# Patient Record
Sex: Male | Born: 1955 | Race: Black or African American | Hispanic: No | Marital: Single | Smoking: Never smoker
Health system: Southern US, Community
[De-identification: ages and names within clinical notes are randomized; demographics above are authoritative.]

## PROBLEM LIST (undated history)

## (undated) DIAGNOSIS — E119 Type 2 diabetes mellitus without complications: Secondary | ICD-10-CM

## (undated) DIAGNOSIS — N433 Hydrocele, unspecified: Secondary | ICD-10-CM

---

## 2004-10-17 ENCOUNTER — Inpatient Hospital Stay (HOSPITAL_COMMUNITY): Admission: EM | Admit: 2004-10-17 | Discharge: 2004-10-20 | Payer: Self-pay | Admitting: Emergency Medicine

## 2004-10-22 HISTORY — PX: OTHER SURGICAL HISTORY: SHX169

## 2004-12-26 ENCOUNTER — Ambulatory Visit (HOSPITAL_COMMUNITY): Admission: RE | Admit: 2004-12-26 | Discharge: 2004-12-26 | Payer: Self-pay | Admitting: Orthopedic Surgery

## 2004-12-26 HISTORY — PX: OTHER SURGICAL HISTORY: SHX169

## 2006-06-24 ENCOUNTER — Emergency Department (HOSPITAL_COMMUNITY): Admission: EM | Admit: 2006-06-24 | Discharge: 2006-06-24 | Payer: Self-pay | Admitting: Emergency Medicine

## 2006-09-02 ENCOUNTER — Emergency Department (HOSPITAL_COMMUNITY): Admission: EM | Admit: 2006-09-02 | Discharge: 2006-09-02 | Payer: Self-pay | Admitting: Emergency Medicine

## 2007-04-23 ENCOUNTER — Encounter (INDEPENDENT_AMBULATORY_CARE_PROVIDER_SITE_OTHER): Payer: Self-pay | Admitting: Family Medicine

## 2007-04-23 ENCOUNTER — Ambulatory Visit: Payer: Self-pay | Admitting: Internal Medicine

## 2007-04-23 LAB — CONVERTED CEMR LAB
ALT: 23 units/L (ref 0–53)
BUN: 12 mg/dL (ref 6–23)
Cholesterol: 168 mg/dL (ref 0–200)
Glucose, Bld: 107 mg/dL — ABNORMAL HIGH (ref 70–99)
HCT: 42.8 % (ref 39.0–52.0)
LDL Cholesterol: 96 mg/dL (ref 0–99)
Lymphocytes Relative: 53 % — ABNORMAL HIGH (ref 12–46)
Lymphs Abs: 2.7 10*3/uL (ref 0.7–4.0)
MCHC: 32.5 g/dL (ref 30.0–36.0)
MCV: 91.8 fL (ref 78.0–100.0)
Monocytes Absolute: 0.3 10*3/uL (ref 0.1–1.0)
Monocytes Relative: 6 % (ref 3–12)
Neutrophils Relative %: 39 % — ABNORMAL LOW (ref 43–77)
Potassium: 4.7 meq/L (ref 3.5–5.3)
RBC: 4.66 M/uL (ref 4.22–5.81)
RDW: 13.6 % (ref 11.5–15.5)
Total Bilirubin: 0.3 mg/dL (ref 0.3–1.2)
VLDL: 15 mg/dL (ref 0–40)

## 2007-04-24 ENCOUNTER — Ambulatory Visit: Payer: Self-pay | Admitting: *Deleted

## 2007-06-04 ENCOUNTER — Ambulatory Visit: Payer: Self-pay | Admitting: Family Medicine

## 2007-06-06 ENCOUNTER — Ambulatory Visit (HOSPITAL_COMMUNITY): Admission: RE | Admit: 2007-06-06 | Discharge: 2007-06-06 | Payer: Self-pay | Admitting: Family Medicine

## 2007-06-24 ENCOUNTER — Ambulatory Visit: Payer: Self-pay | Admitting: Internal Medicine

## 2007-11-26 ENCOUNTER — Ambulatory Visit: Payer: Self-pay | Admitting: Internal Medicine

## 2007-12-03 ENCOUNTER — Ambulatory Visit: Payer: Self-pay | Admitting: Internal Medicine

## 2008-05-25 ENCOUNTER — Ambulatory Visit: Payer: Self-pay | Admitting: Family Medicine

## 2008-05-25 LAB — CONVERTED CEMR LAB
BUN: 12 mg/dL (ref 6–23)
CO2: 22 meq/L (ref 19–32)
Calcium: 9.2 mg/dL (ref 8.4–10.5)
Chloride: 103 meq/L (ref 96–112)
HDL: 53 mg/dL (ref >39)
LDL Cholesterol: 139 mg/dL — ABNORMAL HIGH (ref 0–99)
Sodium: 137 meq/L (ref 135–145)
Triglycerides: 80 mg/dL (ref <150)

## 2008-07-28 ENCOUNTER — Ambulatory Visit: Payer: Self-pay | Admitting: Internal Medicine

## 2008-08-03 ENCOUNTER — Ambulatory Visit: Payer: Self-pay | Admitting: Internal Medicine

## 2008-08-16 ENCOUNTER — Ambulatory Visit: Payer: Self-pay | Admitting: Internal Medicine

## 2008-08-31 ENCOUNTER — Ambulatory Visit: Payer: Self-pay | Admitting: Adult Health

## 2008-12-13 ENCOUNTER — Ambulatory Visit: Payer: Self-pay | Admitting: Internal Medicine

## 2009-01-17 ENCOUNTER — Ambulatory Visit: Payer: Self-pay | Admitting: Internal Medicine

## 2009-04-04 ENCOUNTER — Ambulatory Visit: Payer: Self-pay | Admitting: Internal Medicine

## 2009-04-05 ENCOUNTER — Encounter (INDEPENDENT_AMBULATORY_CARE_PROVIDER_SITE_OTHER): Payer: Self-pay | Admitting: Family Medicine

## 2009-04-12 ENCOUNTER — Ambulatory Visit: Payer: Self-pay | Admitting: Internal Medicine

## 2009-04-12 ENCOUNTER — Encounter (INDEPENDENT_AMBULATORY_CARE_PROVIDER_SITE_OTHER): Payer: Self-pay | Admitting: Adult Health

## 2009-04-12 LAB — CONVERTED CEMR LAB
ALT: 22 units/L (ref 0–53)
BUN: 15 mg/dL (ref 6–23)
CO2: 23 meq/L (ref 19–32)
Calcium: 9.8 mg/dL (ref 8.4–10.5)
Chloride: 93 meq/L — ABNORMAL LOW (ref 96–112)
Creatinine, Ser: 1.05 mg/dL (ref 0.40–1.50)
Sodium: 135 meq/L (ref 135–145)
Total Bilirubin: 0.3 mg/dL (ref 0.3–1.2)

## 2010-10-06 NOTE — Op Note (Signed)
NAME:  RAHKEEM, SENFT NO.:  [id number]   MEDICAL RECORD NO.:  [id number]          PATIENT TYPE:  OIB   LOCATION:  2899                         FACILITY:  [facility]   PHYSICIAN:  Burnard Bunting, M.D.    DATE OF BIRTH:  [birth year]   DATE OF PROCEDURE:  12/26/2004  DATE OF DISCHARGE:  12/26/2004                                 OPERATIVE REPORT   PREOPERATIVE DIAGNOSIS:  Retained hardware right ankle.   POSTOPERATIVE DIAGNOSIS:  Retained hardware right ankle.   OPERATION PERFORMED:  Removal of syndesmotic screw, right ankle.   SURGEON:  Burnard Bunting, M.D.   ANESTHESIA:  General endotracheal.   ESTIMATED BLOOD LOSS:  2 mL.   DRAINS:  None.   DESCRIPTION OF PROCEDURE:  The patient was brought to the operating room  where general anesthesia was induced.  Preoperative antibiotics were  administered.  Right ankle was prepped with Duraprep solution and draped in  sterile manner.  The prior incision was utilized.  The operative field was  covered with Ioban.  Ankle Esmarch was applied.  A 1 cm incision was made  over the syndesmotic screw.  Freer elevator was used to elevate soft tissue  on the anterior and posterior aspect of the screw head.  The screw was then  removed.  Deep fascia had to be divided in order to achieve screw  visualization.  Screws were removed and under fluoroscopic evaluation, the  syndesmosis was found to be stable.  The incision was then thoroughly  irrigated and the ankle Esmarch was released.  The incision was closed using  a 3-0 nylon suture.  Soft dressing was applied.  The patient tolerated the  procedure well without complications.       GSD/MEDQ  D:  12/26/2004  T:  12/26/2004  Job:  [id number]

## 2010-10-06 NOTE — H&P (Signed)
[name], Navarro               ACCOUNT NO.:  [id number]   MEDICAL RECORD NO.:  [id number]          PATIENT TYPE:  INP   LOCATION:  1823                         FACILITY:  [facility]   PHYSICIAN:  Burnard Bunting, M.D.    DATE OF BIRTH:  June 28, 1955   DATE OF ADMISSION:  10/17/2004  DATE OF DISCHARGE:                                HISTORY & PHYSICAL   CHIEF COMPLAINT:  Right ankle pain.   HISTORY OF PRESENT ILLNESS:  Mr. Joseph Navarro is a 55 year old patient  status post fall this afternoon. He reports right ankle pain. Denies any  other orthopedic complaints. Denies any loss of consciousness. He is unable  to bear weight on the right ankle. He reports some significant sharp,  shooting pain in the ankle region.   PAST MEDICAL HISTORY:  Negative.   PAST SURGICAL HISTORY:  Negative.   ALLERGIES:  He has no known drug allergies.   CURRENT MEDICATIONS:  None.   FAMILY HISTORY:  His family does live in [place].   SOCIAL HISTORY:  He works at a [employer].   REVIEW OF SYMPTOMS:  Negative for fevers, chills, abdominal pain, chest  pain, shortness of breath, or weight loss. There are 14 other systems  reviewed and are negative.   PHYSICAL EXAMINATION:  VITAL SIGNS:  Pulse 66, respiration 18, blood  pressure 130/63. Room air saturation is 94%.  CHEST:  Clear to auscultation.  HEART:  Regular rate and rhythm.  ABDOMEN:  Benign.  EXTREMITIES:  Right ankle does show slight deformity. He has DP pulses 2+/4.  He has an abrasion medially. Sensation is intact in dorsal and plantar  aspect of his foot. He does have good toe flexion and extension. He has no  joint tenderness on internal and external rotation of either leg. No other  mass or skin changes noted in the lower extremities. Knee range of motion is  full on the right with no effusion. Full range of motion without tenderness  of the wrist, elbows, and shoulders.  NECK:  He has good neck range of motion.   LABORATORY DATA:   Troponin negative x2. His sodium and potassium was 139 and  4.4. BUN and creatinine 15 and 1.1. Glucose 133. White count 8.7, hemoglobin  13.3. Chest x-ray is pending. X-ray of the ankle do show a fibula fracture,  possible syndesmotic injury with widening of the space.   IMPRESSION:  Right ankle fracture.   PLAN:  Open reduction/internal fixation with possible syndesmosis fixation.  Risks and benefits are discussed with the patient. They include but are not  limited to infection, nerve and vessel damage, blood clot formation,  nonunion or malunion, need for further surgery. The patient understands and  wishes to proceed. All questions were answered.      GSD/MEDQ  D:  10/17/2004  T:  10/18/2004  Job:  [id number]

## 2010-10-06 NOTE — Op Note (Signed)
NAME:  ILAN, KAHRS NO.:  [id number]   MEDICAL RECORD NO.:  [id number]          PATIENT TYPE:  INP   LOCATION:  5019                         FACILITY:  [facility]   PHYSICIAN:  Burnard Bunting, M.D.    DATE OF BIRTH:  05-Jun-1955   DATE OF PROCEDURE:  10/22/2004  DATE OF DISCHARGE:  10/20/2004                                 OPERATIVE REPORT   PREOPERATIVE DIAGNOSIS:  Right ankle fracture.   POSTOPERATIVE DIAGNOSIS:  Right ankle fracture.   OPERATION PERFORMED:  Right ankle fracture open reduction internal fixation  with syndesmotic fixation and lateral malleolar fixation.   SURGEON:  Burnard Bunting, M.D.   ASSISTANT:  None.   ANESTHESIA:  General endotracheal.   ESTIMATED BLOOD LOSS:  Minimal.   DESCRIPTION OF PROCEDURE:  The patient was brought to the operating room  where general endotracheal anesthesia was induced.  Preoperative IV  antibiotics were administered.  The right leg was prepped with DuraPrep  solution and draped in a sterile manner.  Collier Flowers was used to cover the  operative field.  The leg was elevated.  Ankle Esmarch was utilized for  approximately 40 minutes.  A lateral incision was made over the lateral  malleolus.  Skin and subcutaneous tissue were sharply divided.  Care was  taken to protect the superficial peroneal nerve. It was retracted  anteriorly.  The fracture site was identified.  It was reduced under direct  visualization.  Lag screw was placed from proximal anterior to distal  posterior with good compression of the fracture site achieved.  Buttress  plate was then placed.  One third Synthes tubular locking plate was utilized  six holes.  Good fixation in the proximal and distal fragments was achieved.  Under fluoroscopic evaluation, the syndesmosis was found to be unstable.  Syndesmotic screw was placed in bicortical fashion.  Good plane with  excellent fixation and reduction of the mortise achieved.  At this time the  ankle Esmarch  was released.  Bleeding points encountered were controlled  using electrocautery.  Incision was thoroughly irrigated and the fascia was  closed using 3-0 Vicryl suture.  Skin was closed using interrupted inverted  3-0 Vicryl suture and 3-0 simple nylon sutures.  Bulky splint was placed.  The patient tolerated the procedure well without immediate complications.  It should be noted that fluoroscopy was utilized at all times during the  case to verify placement of screw and hardware.      GSD/MEDQ  D:  10/22/2004  T:  10/23/2004  Job:  [id number]

## 2010-10-06 NOTE — Discharge Summary (Signed)
NAME:  [name], [name] NO.:  [id number]   MEDICAL RECORD NO.:  [id number]          PATIENT TYPE:  INP   LOCATION:  5019                         FACILITY:  [facility]   PHYSICIAN:  Burnard Bunting, M.D.    DATE OF BIRTH:  Sep 15, 1955   DATE OF ADMISSION:  10/17/2004  DATE OF DISCHARGE:  10/20/2004                                 DISCHARGE SUMMARY   DISCHARGE DIAGNOSIS:  Right ankle fracture.   SECONDARY DIAGNOSES:  None.   HOSPITAL COURSE:  Joseph Navarro is a 55 year old ambulatory male who fell  on the day of admission.  He sustained an ankle fracture with syndesmotic  injury to his right ankle.  He underwent open reduction and internal  fixation on Oct 17, 2004.  He tolerated the procedure well without  complication.  Dorsiflexion, plantar flexion and sensation to the toes was  intact on postop day #1.  The patient was started on Coumadin for DVT  prophylaxis.  He had an otherwise unremarkable recovery.  The patient was  discharged on October 20, 2004, in good condition.   DISCHARGE MEDICATIONS:  Percocet for pain, Robaxin as a muscle relaxer, as  well as Coumadin for DVT prophylaxis.   He will maintain his nonweightbearing status and see me back in seven days  for inspection of the incision.           ______________________________  G. Dorene Grebe, M.D.     GSD/MEDQ  D:  01/17/2005  T:  01/18/2005  Job:  [id number]

## 2012-01-20 ENCOUNTER — Emergency Department (HOSPITAL_COMMUNITY)
Admission: EM | Admit: 2012-01-20 | Discharge: 2012-01-20 | Disposition: A | Discharge status: Home / Self Care | Payer: Self-pay | Attending: Emergency Medicine | Admitting: Emergency Medicine

## 2012-01-20 ENCOUNTER — Encounter (HOSPITAL_COMMUNITY): Payer: Self-pay | Admitting: *Deleted

## 2012-01-20 DIAGNOSIS — W540XXA Bitten by dog, initial encounter: Secondary | ICD-10-CM | POA: Insufficient documentation

## 2012-01-20 DIAGNOSIS — S81009A Unspecified open wound, unspecified knee, initial encounter: Secondary | ICD-10-CM | POA: Insufficient documentation

## 2012-01-20 MED ORDER — TETANUS-DIPHTHERIA TOXOIDS TD 5-2 LFU IM INJ
0.5000 mL | INJECTION | Freq: Once | INTRAMUSCULAR | Status: AC
  Administered 2012-01-20: 0.5 mL via INTRAMUSCULAR
  Filled 2012-01-20: qty 0.5

## 2012-01-20 MED ORDER — SODIUM CHLORIDE 0.9 % IV SOLN
3.0000 g | Freq: Once | INTRAVENOUS | Status: AC
  Administered 2012-01-20: 3 g via INTRAVENOUS
  Filled 2012-01-20: qty 3

## 2012-01-20 MED ORDER — HYDROMORPHONE HCL PF 1 MG/ML IJ SOLN
1.0000 mg | Freq: Once | INTRAMUSCULAR | Status: AC
  Administered 2012-01-20: 1 mg via INTRAVENOUS
  Filled 2012-01-20: qty 1

## 2012-01-20 MED ORDER — OXYCODONE-ACETAMINOPHEN 7.5-325 MG PO TABS: 1.0000 | ORAL_TABLET | ORAL | Status: DC | PRN

## 2012-01-20 MED ORDER — TETANUS-DIPHTH-ACELL PERTUSSIS 5-2.5-18.5 LF-MCG/0.5 IM SUSP
INTRAMUSCULAR | Status: AC
  Filled 2012-01-20: qty 0.5

## 2012-01-20 MED ORDER — AMOXICILLIN-POT CLAVULANATE 875-125 MG PO TABS: 1.0000 | ORAL_TABLET | Freq: Two times a day (BID) | ORAL | Status: DC

## 2012-01-20 NOTE — ED Notes (Signed)
Pt was visiting an acquaintance who turned out to not be at home when their pit bull dog came out and attacked him. He sustained significant bite wound to R lateral LE and L medial LE with scattered abrasions to BLE. Muscle, adipose and fascia exposed. Wounds covered with wet NS gauze. Attack occurred at approx 1230. After attack, pt drove himself to his residence and called EMS.

## 2012-01-20 NOTE — ED Provider Notes (Addendum)
History     CSN: [id number]  Arrival date & time 01/20/12  1425   First MD Initiated Contact with Patient 01/20/12 [phone]      Chief Complaint  Patient presents with  . Animal Bite    (Consider location/radiation/quality/duration/timing/severity/associated sxs/prior treatment) Patient is a 56 y.o. male presenting with animal bite. The history is provided by the patient.  Animal Bite    Patient here after being bit by a dog just prior to arrival just prior to arrival. Patient complains of pain to his calves with associated large lacerations. Attack was provoked by the patient walked into a dog territory. No concern for rabies at this time. Patient's wounds were dressed and patient presents here. Denies any numbness or tingling in his feet. Patient's tetanus status is not current Past Medical History  Diagnosis Date  . Diabetes mellitus     History reviewed. No pertinent past surgical history.  No family history on file.  History  Substance Use Topics  . Smoking status: Never Smoker   . Smokeless tobacco: Not on file  . Alcohol Use: Yes      Review of Systems  All other systems reviewed and are negative.    Allergies  Review of patient's allergies indicates no known allergies.  Home Medications  No current outpatient prescriptions on file.  BP 131/85  Pulse 80  Temp 97.9 F (36.6 C) (Temporal)  Resp 18  SpO2 97%  Physical Exam  Nursing note and vitals reviewed. Constitutional: He is oriented to person, place, and time. Vital signs are normal. He appears well-developed and well-nourished.  Non-toxic appearance. No distress.  HENT:  Head: Normocephalic and atraumatic.  Eyes: Conjunctivae, EOM and lids are normal. Pupils are equal, round, and reactive to light.  Neck: Normal range of motion. Neck supple. No tracheal deviation present. No mass present.  Cardiovascular: Normal rate, regular rhythm and normal heart sounds.  Exam reveals no gallop.   No murmur  heard. Pulmonary/Chest: Effort normal and breath sounds normal. No stridor. No respiratory distress. He has no decreased breath sounds. He has no wheezes. He has no rhonchi. He has no rales.  Abdominal: Soft. Normal appearance and bowel sounds are normal. He exhibits no distension. There is no tenderness. There is no rebound and no CVA tenderness.  Musculoskeletal: Normal range of motion. He exhibits no edema and no tenderness.       Large bilateral lower extremity lacerations to his calves. Neurovascular intact. Bleeding controlled  Neurological: He is alert and oriented to person, place, and time. He has normal strength. No cranial nerve deficit or sensory deficit. GCS eye subscore is 4. GCS verbal subscore is 5. GCS motor subscore is 6.  Skin: Skin is warm and dry. No abrasion and no rash noted.  Psychiatric: He has a normal mood and affect. His speech is normal and behavior is normal.    ED Course  Procedures (including critical care time)  Labs Reviewed - No data to display No results found.   No diagnosis found.    MDM  Laceration repaired by mlp, patient given IV antibiotics and tetanus status was updated. Will be placed on Augmentin and given pain medication. No concern for rabies as the dog is able to be quarantined       Toy Baker, MD 01/20/12 1744  Toy Baker, MD 01/20/12 [phone]

## 2012-01-20 NOTE — ED Provider Notes (Signed)
LACERATION REPAIR Performed by: Wynetta Emery Authorized by: Wynetta Emery Consent: Verbal consent obtained. Risks and benefits: risks, benefits and alternatives were discussed Consent given by: patient Patient identity confirmed: provided demographic data Prepped and Draped in normal sterile fashion Wound explored  Laceration Location: Left medial calf  Laceration Length: 15cm  No Foreign Bodies seen or palpated  Anesthesia: local infiltration  Local anesthetic: lidocaine 2% without epinephrine  Anesthetic total: 10 ml  Irrigation method: syringe Amount of cleaning: standard  Skin closure: Staples   Number of sutures: 15    Patient tolerance: Patient tolerated the procedure well with no immediate complications.   LACERATION REPAIR Performed by: Wynetta Emery Authorized by: Wynetta Emery Consent: Verbal consent obtained. Risks and benefits: risks, benefits and alternatives were discussed Consent given by: patient Patient identity confirmed: provided demographic data Prepped and Draped in normal sterile fashion Wound explored  Laceration Location: Right lateral shin  Laceration Length: 10 cm  No Foreign Bodies seen or palpated  Anesthesia: local infiltration  Local anesthetic: lidocaine 2% without epinephrine  Anesthetic total: 10 ml  Irrigation method: syringe Amount of cleaning: standard  Skin closure: Staples   Number of sutures: 10    Patient tolerance: Patient tolerated the procedure well with no immediate complications.   Wynetta Emery, PA-C 01/20/12 1707

## 2012-01-20 NOTE — ED Notes (Signed)
Lac cart at bedside  ?

## 2012-01-20 NOTE — Progress Notes (Signed)
ED PA closed puncture wound.

## 2012-01-20 NOTE — ED Provider Notes (Signed)
Medical screening examination/treatment/procedure(s) were conducted as a shared visit with non-physician practitioner(s) and myself.  I personally evaluated the patient during the encounter  Joseph Cremer T Moneka Mcquinn, MD 01/20/12 2325 

## 2012-01-20 NOTE — ED Notes (Signed)
PA at bedside to reassess wound.

## 2012-01-20 NOTE — ED Notes (Signed)
Water given to pt 

## 2012-01-20 NOTE — Progress Notes (Signed)
ED MD notified of pt's current vitals. Advised by MD to offer PO fluids and cont to monitor.

## 2012-01-20 NOTE — Progress Notes (Signed)
When cleaning pt's leg up for d/c, it was noted that he had a 0.5cm (approx) puncture wound that was agape and still oozing blood. ED PA notified and will go reassess.

## 2012-01-20 NOTE — ED Notes (Signed)
Pt went to deliver wood to a friend, attacked on bil legs by ?Piit Bull, unknown about shots for animal. Pt  Did not fall or any other injuries, exposed muscle with both injuries

## 2012-01-27 ENCOUNTER — Encounter (HOSPITAL_COMMUNITY): Payer: Self-pay | Admitting: Emergency Medicine

## 2012-01-27 ENCOUNTER — Emergency Department (HOSPITAL_COMMUNITY)
Admission: EM | Admit: 2012-01-27 | Discharge: 2012-01-27 | Disposition: A | Discharge status: Home / Self Care | Payer: Self-pay | Attending: Emergency Medicine | Admitting: Emergency Medicine

## 2012-01-27 DIAGNOSIS — W540XXA Bitten by dog, initial encounter: Secondary | ICD-10-CM | POA: Insufficient documentation

## 2012-01-27 DIAGNOSIS — T148XXA Other injury of unspecified body region, initial encounter: Secondary | ICD-10-CM | POA: Insufficient documentation

## 2012-01-27 DIAGNOSIS — E119 Type 2 diabetes mellitus without complications: Secondary | ICD-10-CM | POA: Insufficient documentation

## 2012-01-27 DIAGNOSIS — L089 Local infection of the skin and subcutaneous tissue, unspecified: Secondary | ICD-10-CM | POA: Insufficient documentation

## 2012-01-27 MED ORDER — AMOXICILLIN-POT CLAVULANATE 875-125 MG PO TABS: 1.0000 | ORAL_TABLET | Freq: Two times a day (BID) | ORAL | Status: AC

## 2012-01-27 NOTE — ED Notes (Signed)
Staples removed, feeling better, skin w/d- placed in fowler's position.

## 2012-01-27 NOTE — ED Notes (Signed)
Became very diaphoretic and faint feeling during staple removal. Pt placed in supine position, CBG checked-- 207--

## 2012-01-27 NOTE — ED Notes (Signed)
Staples being removed by Tobi Bastos, [place].

## 2012-01-27 NOTE — ED Notes (Signed)
Here for staple removal from dog bite on left lower leg and right calf. Reddened around wound, no drainage at site, slight swelling, was prescribed Augmentin 1 week ago- has not gotten it filled. Pt is diabetic, PA at bedside, discussion regarding im[portance of medication.

## 2012-01-27 NOTE — ED Provider Notes (Signed)
History     CSN: [id number]  Arrival date & time 01/27/12  1022   First MD Initiated Contact with Patient 01/27/12 1027      Chief Complaint  Patient presents with  . Suture / Staple Removal    (Consider location/radiation/quality/duration/timing/severity/associated sxs/prior treatment) HPI Comments: Patient presents 7 days after being seen for a dog bite for staple removal.  Pt reports he never had his Augmentin prescription filled.  Denies fevers, feeling ill, any pain or discharge from the wound.  Pt is diabetic, does not take any medication for his diabetes.  Denies weakness or numbness of the legs.    Patient is a 56 y.o. male presenting with suture removal. The history is provided by the patient.  Suture / Staple Removal     Past Medical History  Diagnosis Date  . Diabetes mellitus     History reviewed. No pertinent past surgical history.  History reviewed. No pertinent family history.  History  Substance Use Topics  . Smoking status: Never Smoker   . Smokeless tobacco: Not on file  . Alcohol Use: Yes     ocasionally      Review of Systems  Constitutional: Negative for fever and chills.  Skin: Positive for wound.  Neurological: Negative for weakness and numbness.    Allergies  Review of patient's allergies indicates no known allergies.  Home Medications  No current outpatient prescriptions on file.  BP 107/66  Pulse 66  Physical Exam  Nursing note and vitals reviewed. Constitutional: He appears well-developed and well-nourished. No distress.  HENT:  Head: Normocephalic and atraumatic.  Neck: Neck supple.  Pulmonary/Chest: Effort normal.  Neurological: He is alert.  Skin: He is not diaphoretic.       ED Course  Procedures (including critical care time)  Labs Reviewed  GLUCOSE, CAPILLARY - Abnormal; Notable for the following:    Glucose-Capillary 207 (*)     All other components within normal limits   No results found.  Staples removed  by me and by Alena Bills, PA-S.    1. Wound infection       MDM  Afebrile nontoxic patient with dog bite 7 days ago, closed in ED with staples.  Pt returned today for staple removal.  Does have surrounding cellulitis.  No active drainage or evidence of abscess on exam.  Pt never had prescription filled - I have asked case manager to fill through [employer] with our pharmacy.  Augmentin x 2 weeks now that the wounds are infected.  Staples removed in ED.  Wound edges intact, no dehiscence, no drainage.  Discussed diagnosis and concerns with patient.  D/C home with return in two days for recheck.  Pt given return precautions.  Pt verbalizes understanding and agrees with plan.           [place], [place] 01/27/12 1603  [place], [place] 01/27/12 [phone]

## 2012-01-29 NOTE — ED Provider Notes (Signed)
Medical screening examination/treatment/procedure(s) were performed by non-physician practitioner and as supervising physician I was immediately available for consultation/collaboration.  Juliet Rude. Rubin Payor, MD 01/29/12 2017

## 2012-07-09 ENCOUNTER — Emergency Department (HOSPITAL_COMMUNITY)
Admission: EM | Admit: 2012-07-09 | Discharge: 2012-07-09 | Disposition: A | Discharge status: Home / Self Care | Payer: Self-pay | Attending: Emergency Medicine | Admitting: Emergency Medicine

## 2012-07-09 ENCOUNTER — Encounter (HOSPITAL_COMMUNITY): Payer: Self-pay | Admitting: Emergency Medicine

## 2012-07-09 ENCOUNTER — Emergency Department (HOSPITAL_COMMUNITY): Payer: Self-pay

## 2012-07-09 DIAGNOSIS — M7989 Other specified soft tissue disorders: Secondary | ICD-10-CM

## 2012-07-09 DIAGNOSIS — M79609 Pain in unspecified limb: Secondary | ICD-10-CM

## 2012-07-09 DIAGNOSIS — R609 Edema, unspecified: Secondary | ICD-10-CM | POA: Insufficient documentation

## 2012-07-09 DIAGNOSIS — L0291 Cutaneous abscess, unspecified: Secondary | ICD-10-CM | POA: Insufficient documentation

## 2012-07-09 DIAGNOSIS — L0390 Cellulitis, unspecified: Secondary | ICD-10-CM | POA: Insufficient documentation

## 2012-07-09 DIAGNOSIS — IMO0001 Reserved for inherently not codable concepts without codable children: Secondary | ICD-10-CM | POA: Insufficient documentation

## 2012-07-09 DIAGNOSIS — E119 Type 2 diabetes mellitus without complications: Secondary | ICD-10-CM | POA: Insufficient documentation

## 2012-07-09 LAB — CBC WITH DIFFERENTIAL/PLATELET
Eosinophils Absolute: 0 10*3/uL (ref 0.0–0.7)
Eosinophils Relative: 0 % (ref 0–5)
MCHC: 34.8 g/dL (ref 30.0–36.0)
MCV: 87.9 fL (ref 78.0–100.0)
Monocytes Absolute: 1 10*3/uL (ref 0.1–1.0)
Monocytes Relative: 9 % (ref 3–12)
Neutro Abs: 7.2 10*3/uL (ref 1.7–7.7)
Neutrophils Relative %: 70 % (ref 43–77)
Platelets: 302 10*3/uL (ref 150–400)
RBC: 4.71 MIL/uL (ref 4.22–5.81)
RDW: 12.9 % (ref 11.5–15.5)

## 2012-07-09 LAB — BASIC METABOLIC PANEL
BUN: 15 mg/dL (ref 6–23)
CO2: 23 mEq/L (ref 19–32)
Creatinine, Ser: 1.1 mg/dL (ref 0.50–1.35)
GFR calc Af Amer: 86 mL/min — ABNORMAL LOW (ref >90)
GFR calc non Af Amer: 74 mL/min — ABNORMAL LOW (ref >90)
Sodium: 137 mEq/L (ref 135–145)

## 2012-07-09 LAB — PROTIME-INR: Prothrombin Time: 13.9 seconds (ref 11.6–15.2)

## 2012-07-09 MED ORDER — SULFAMETHOXAZOLE-TMP DS 800-160 MG PO TABS
1.0000 | ORAL_TABLET | Freq: Once | ORAL | Status: AC
  Administered 2012-07-09: 1 via ORAL
  Filled 2012-07-09: qty 1

## 2012-07-09 MED ORDER — IBUPROFEN 800 MG PO TABS: 800.0000 mg | ORAL_TABLET | Freq: Three times a day (TID) | ORAL | Status: DC

## 2012-07-09 MED ORDER — SULFAMETHOXAZOLE-TRIMETHOPRIM 800-160 MG PO TABS: 1.0000 | ORAL_TABLET | Freq: Two times a day (BID) | ORAL | Status: DC

## 2012-07-09 NOTE — Progress Notes (Signed)
VASCULAR LAB PRELIMINARY  PRELIMINARY  PRELIMINARY  PRELIMINARY  Left lower extremity venous duplex completed.    Preliminary report:  Left:  No evidence of DVT, superficial thrombosis, or Baker's cyst.  Shaquasha Gerstel, RVS 07/09/2012, 7:36 PM

## 2012-07-09 NOTE — ED Notes (Signed)
Pt presenting to ed with c/o left leg pain pt states he was bit by a dog x 6-7 months ago and his leg is flaring back up. Pt states he came here for the same a couple months ago and the medications we gave him helped. Pt states pain is at the site where the dog bit him. Pt states redness x one day. Pt denies history of blood clots. Pt states leg is fine with walking pain is when he's standing still.

## 2012-07-09 NOTE — ED Provider Notes (Signed)
History    This chart was scribed for Joseph Gourd, PA-C, non-physician practitioner working with Laray Anger, DO by Charolett Bumpers, ED Scribe. This patient was seen in room WTR7/WTR7 and the patient's care was started at 1626.    CSN: [id number]  Arrival date & time 07/09/12  1554   First MD Initiated Contact with Patient 07/09/12 1626      Chief Complaint  Patient presents with  . Leg Pain    The history is provided by the patient. No language interpreter was used.   Lavoy Bernards is a 57 y.o. male who presents to the Emergency Department complaining of gradually worsening, moderate left leg pain that started 2 days ago. He states that he was bit by a dog 6-7 months ago. He states that the leg healed well but started to become sore again 2 days ago. He states that he was walking when the pain started and has been constant since. He states that walking relieves the pain and keeping the leg immobile aggravates the pain. He describes the pain as burning and rates the pain 10/10. He states that he has taken Bayer with temporary relief. He reports associated tingling and redness but denies any numbness, chest pain, SOB. He denies any h/o DVT or blood clots.  Past Medical History  Diagnosis Date  . Diabetes mellitus     History reviewed. No pertinent past surgical history.  No family history on file.  History  Substance Use Topics  . Smoking status: Never Smoker   . Smokeless tobacco: Not on file  . Alcohol Use: Yes     Comment: ocasionally      Review of Systems  Constitutional: Negative for fever and chills.  Musculoskeletal: Positive for myalgias. Negative for gait problem.  All other systems reviewed and are negative.    Allergies  Review of patient's allergies indicates no known allergies.  Home Medications   Current Outpatient Rx  Name  Route  Sig  Dispense  Refill  . Acetaminophen-Aspirin Buffered (EXCEDRIN BACK & BODY) 250-250 MG tablet    Oral   Take 2 tablets by mouth every 4 (four) hours as needed for pain.         . Multiple Vitamin (MULTIVITAMIN WITH MINERALS) TABS   Oral   Take 1 tablet by mouth daily.           BP 146/95  Pulse 101  Temp(Src) 98.7 F (37.1 C) (Oral)  Resp 22  SpO2 100%  Physical Exam  Nursing note and vitals reviewed. Constitutional: He is oriented to person, place, and time. He appears well-developed and well-nourished. No distress.  HENT:  Head: Normocephalic and atraumatic.  Eyes: EOM are normal. Pupils are equal, round, and reactive to light.  Neck: Normal range of motion. Neck supple. No tracheal deviation present.  Cardiovascular: Normal rate, regular rhythm, normal heart sounds and intact distal pulses.   Pulmonary/Chest: Effort normal and breath sounds normal. No respiratory distress.  Abdominal: Soft. He exhibits no distension.  Musculoskeletal: Normal range of motion. He exhibits edema and tenderness.  +1 pitting edema in the RLE with overlying erythema and extreme calf tenderness in the mid left calf. Left calf appears slightly larger than the right. Intact distal pulses. Warm to touch.  Neurological: He is alert and oriented to person, place, and time.  Skin: Skin is warm and dry. There is erythema.  Psychiatric: He has a normal mood and affect. His behavior is normal.    ED  Course  Procedures (including critical care time)  DIAGNOSTIC STUDIES: Oxygen Saturation is 100% on room air, normal by my interpretation.    COORDINATION OF CARE:  16:35-Discussed planned course of treatment with the patient including an ultrasound to rule out a DVT and an x-ray, who is agreeable at this time. Will also order a CBC, BMP and Protime-INR  Results for orders placed during the hospital encounter of 07/09/12  CBC WITH DIFFERENTIAL      Result Value Range   WBC 10.3  4.0 - 10.5 K/uL   RBC 4.71  4.22 - 5.81 MIL/uL   Hemoglobin 14.4  13.0 - 17.0 g/dL   HCT 60.4  54.0 - 98.1 %   MCV  87.9  78.0 - 100.0 fL   MCH 30.6  26.0 - 34.0 pg   MCHC 34.8  30.0 - 36.0 g/dL   RDW 19.1  47.8 - 29.5 %   Platelets 302  150 - 400 K/uL   Neutrophils Relative 70  43 - 77 %   Neutro Abs 7.2  1.7 - 7.7 K/uL   Lymphocytes Relative 21  12 - 46 %   Lymphs Abs 2.1  0.7 - 4.0 K/uL   Monocytes Relative 9  3 - 12 %   Monocytes Absolute 1.0  0.1 - 1.0 K/uL   Eosinophils Relative 0  0 - 5 %   Eosinophils Absolute 0.0  0.0 - 0.7 K/uL   Basophils Relative 0  0 - 1 %   Basophils Absolute 0.0  0.0 - 0.1 K/uL  BASIC METABOLIC PANEL      Result Value Range   Sodium 137  135 - 145 mEq/L   Potassium 4.6  3.5 - 5.1 mEq/L   Chloride 99  96 - 112 mEq/L   CO2 23  19 - 32 mEq/L   Glucose, Bld 147 (*) 70 - 99 mg/dL   BUN 15  6 - 23 mg/dL   Creatinine, Ser 6.21  0.50 - 1.35 mg/dL   Calcium 9.7  8.4 - 30.8 mg/dL   GFR calc non Af Amer 74 (*) >90 mL/min   GFR calc Af Amer 86 (*) >90 mL/min  PROTIME-INR      Result Value Range   Prothrombin Time 13.9  11.6 - 15.2 seconds   INR 1.08  0.00 - 1.49    Dg Tibia/fibula Left  07/09/2012  *RADIOLOGY REPORT*  Clinical Data: Lower leg pain for 2 days  LEFT TIBIA AND FIBULA - 2 VIEW  Comparison: None.  Findings: Four views of the left tibia-fibula submitted.  No acute fracture or subluxation.  Plantar spur of the calcaneus.  IMPRESSION: No acute fracture or subluxation.   Original Report Authenticated By: Natasha Mead, M.D.      1. Cellulitis   2. Leg pain       MDM  57 year old male with left-sided leg pain a few months after dog bite. My initial concern was for a blood clot. Duplex ultrasound negative for DVT. X-ray negative for acute abnormality. I will treat him for cellulitis due to his edema, erythema and warmth. I prescribed him Bactrim and advised him to take ibuprofen. Crutches given for comfort. He will rest and elevate his leg. Resource guide given for followup. Return precautions discussed. Patient states understanding of plan and is  agreeable.   I personally performed the services described in this documentation, which was scribed in my presence. The recorded information has been reviewed and is accurate.  Trevor Mace, PA-C 07/09/12 1941

## 2012-07-10 NOTE — ED Provider Notes (Signed)
Medical screening examination/treatment/procedure(s) were performed by non-physician practitioner and as supervising physician I was immediately available for consultation/collaboration.   Pravin Perezperez M Xitlali Kastens, DO 07/10/12 1144 

## 2014-07-26 ENCOUNTER — Other Ambulatory Visit: Payer: Self-pay | Admitting: Urology

## 2014-08-23 ENCOUNTER — Encounter (HOSPITAL_BASED_OUTPATIENT_CLINIC_OR_DEPARTMENT_OTHER): Payer: Self-pay | Admitting: *Deleted

## 2014-08-24 ENCOUNTER — Encounter (HOSPITAL_BASED_OUTPATIENT_CLINIC_OR_DEPARTMENT_OTHER): Payer: Self-pay | Admitting: *Deleted

## 2014-08-24 NOTE — Progress Notes (Signed)
NPO AFTER MN.  ARRIVE AT 0830.  NEEDS ISTAT AND EKG.   

## 2014-08-27 ENCOUNTER — Ambulatory Visit (HOSPITAL_BASED_OUTPATIENT_CLINIC_OR_DEPARTMENT_OTHER): Payer: PRIVATE HEALTH INSURANCE | Admitting: Certified Registered"

## 2014-08-27 ENCOUNTER — Encounter (HOSPITAL_BASED_OUTPATIENT_CLINIC_OR_DEPARTMENT_OTHER): Payer: Self-pay | Admitting: *Deleted

## 2014-08-27 ENCOUNTER — Encounter (HOSPITAL_BASED_OUTPATIENT_CLINIC_OR_DEPARTMENT_OTHER)
Admission: RE | Disposition: A | Discharge status: Home / Self Care | Payer: Self-pay | Source: Ambulatory Visit | Attending: Urology

## 2014-08-27 ENCOUNTER — Other Ambulatory Visit: Payer: Self-pay

## 2014-08-27 ENCOUNTER — Ambulatory Visit (HOSPITAL_BASED_OUTPATIENT_CLINIC_OR_DEPARTMENT_OTHER)
Admission: RE | Admit: 2014-08-27 | Discharge: 2014-08-27 | Disposition: A | Discharge status: Home / Self Care | Payer: PRIVATE HEALTH INSURANCE | Source: Ambulatory Visit | Attending: Urology | Admitting: Urology

## 2014-08-27 DIAGNOSIS — E669 Obesity, unspecified: Secondary | ICD-10-CM | POA: Insufficient documentation

## 2014-08-27 DIAGNOSIS — E119 Type 2 diabetes mellitus without complications: Secondary | ICD-10-CM | POA: Insufficient documentation

## 2014-08-27 DIAGNOSIS — N433 Hydrocele, unspecified: Secondary | ICD-10-CM | POA: Diagnosis present

## 2014-08-27 DIAGNOSIS — Z6831 Body mass index (BMI) 31.0-31.9, adult: Secondary | ICD-10-CM | POA: Diagnosis not present

## 2014-08-27 HISTORY — DX: Hydrocele, unspecified: N43.3

## 2014-08-27 HISTORY — PX: HYDROCELE EXCISION: SHX482

## 2014-08-27 HISTORY — DX: Type 2 diabetes mellitus without complications: E11.9

## 2014-08-27 LAB — POCT I-STAT, CHEM 8
BUN: 14 mg/dL (ref 6–23)
Calcium, Ion: 1.27 mmol/L — ABNORMAL HIGH (ref 1.12–1.23)
Chloride: 101 mmol/L (ref 96–112)
Creatinine, Ser: 0.8 mg/dL (ref 0.50–1.35)
Glucose, Bld: 247 mg/dL — ABNORMAL HIGH (ref 70–99)
HCT: 45 % (ref 39.0–52.0)
Hemoglobin: 15.3 g/dL (ref 13.0–17.0)
Potassium: 4.6 mmol/L (ref 3.5–5.1)
Sodium: 138 mmol/L (ref 135–145)
TCO2: 24 mmol/L (ref 0–100)

## 2014-08-27 LAB — GLUCOSE, CAPILLARY: Glucose-Capillary: 193 mg/dL — ABNORMAL HIGH (ref 70–99)

## 2014-08-27 SURGERY — HYDROCELECTOMY
Anesthesia: General | Site: Scrotum | Laterality: Left

## 2014-08-27 MED ORDER — MIDAZOLAM HCL 5 MG/5ML IJ SOLN
INTRAMUSCULAR | Status: DC | PRN
  Administered 2014-08-27: 1 mg via INTRAVENOUS

## 2014-08-27 MED ORDER — PROMETHAZINE HCL 25 MG/ML IJ SOLN
6.2500 mg | INTRAMUSCULAR | Status: DC | PRN
  Filled 2014-08-27: qty 1

## 2014-08-27 MED ORDER — CEFAZOLIN SODIUM-DEXTROSE 2-3 GM-% IV SOLR
INTRAVENOUS | Status: AC
  Filled 2014-08-27: qty 50

## 2014-08-27 MED ORDER — FENTANYL CITRATE 0.05 MG/ML IJ SOLN
INTRAMUSCULAR | Status: DC | PRN
  Administered 2014-08-27: 25 ug via INTRAVENOUS
  Administered 2014-08-27: 50 ug via INTRAVENOUS
  Administered 2014-08-27: 25 ug via INTRAVENOUS

## 2014-08-27 MED ORDER — CEFAZOLIN SODIUM-DEXTROSE 2-3 GM-% IV SOLR
2.0000 g | INTRAVENOUS | Status: AC
  Administered 2014-08-27: 2 g via INTRAVENOUS
  Filled 2014-08-27: qty 50

## 2014-08-27 MED ORDER — DEXAMETHASONE SODIUM PHOSPHATE 4 MG/ML IJ SOLN
INTRAMUSCULAR | Status: DC | PRN
  Administered 2014-08-27: 4 mg via INTRAVENOUS

## 2014-08-27 MED ORDER — LIDOCAINE HCL (CARDIAC) 20 MG/ML IV SOLN
INTRAVENOUS | Status: DC | PRN
  Administered 2014-08-27: 50 mg via INTRAVENOUS

## 2014-08-27 MED ORDER — PROPOFOL 10 MG/ML IV BOLUS
INTRAVENOUS | Status: DC | PRN
  Administered 2014-08-27: 180 mg via INTRAVENOUS

## 2014-08-27 MED ORDER — FENTANYL CITRATE 0.05 MG/ML IJ SOLN
INTRAMUSCULAR | Status: AC
  Filled 2014-08-27: qty 4

## 2014-08-27 MED ORDER — FENTANYL CITRATE 0.05 MG/ML IJ SOLN
25.0000 ug | INTRAMUSCULAR | Status: DC | PRN
  Filled 2014-08-27: qty 1

## 2014-08-27 MED ORDER — ONDANSETRON HCL 4 MG/2ML IJ SOLN
INTRAMUSCULAR | Status: DC | PRN
  Administered 2014-08-27: 4 mg via INTRAVENOUS

## 2014-08-27 MED ORDER — OXYCODONE HCL 10 MG PO TABS: 10.0000 mg | ORAL_TABLET | ORAL | Status: DC | PRN

## 2014-08-27 MED ORDER — TRIPLE ANTIBIOTIC 3.5-400-5000 EX OINT
TOPICAL_OINTMENT | CUTANEOUS | Status: DC | PRN
  Administered 2014-08-27: 1

## 2014-08-27 MED ORDER — INSULIN ASPART 100 UNIT/ML ~~LOC~~ SOLN
SUBCUTANEOUS | Status: DC | PRN
  Administered 2014-08-27: 4 [IU] via SUBCUTANEOUS

## 2014-08-27 MED ORDER — ACETAMINOPHEN 10 MG/ML IV SOLN
INTRAVENOUS | Status: DC | PRN
  Administered 2014-08-27: 1000 mg via INTRAVENOUS

## 2014-08-27 MED ORDER — LACTATED RINGERS IV SOLN
INTRAVENOUS | Status: DC
  Administered 2014-08-27: 09:00:00 via INTRAVENOUS
  Filled 2014-08-27: qty 1000

## 2014-08-27 MED ORDER — BUPIVACAINE-EPINEPHRINE 0.5% -1:200000 IJ SOLN
INTRAMUSCULAR | Status: DC | PRN
  Administered 2014-08-27: 19 mL

## 2014-08-27 MED ORDER — METOCLOPRAMIDE HCL 5 MG/ML IJ SOLN
INTRAMUSCULAR | Status: DC | PRN
  Administered 2014-08-27: 10 mg via INTRAVENOUS

## 2014-08-27 MED ORDER — MIDAZOLAM HCL 2 MG/2ML IJ SOLN
INTRAMUSCULAR | Status: AC
  Filled 2014-08-27: qty 2

## 2014-08-27 SURGICAL SUPPLY — 43 items
BLADE CLIPPER SURG (BLADE) IMPLANT
BLADE SURG 15 STRL LF DISP TIS (BLADE) ×1 IMPLANT
BLADE SURG 15 STRL SS (BLADE) ×2
BNDG GAUZE ELAST 4 BULKY (GAUZE/BANDAGES/DRESSINGS) ×2 IMPLANT
CANISTER SUCTION 1200CC (MISCELLANEOUS) IMPLANT
CANISTER SUCTION 2500CC (MISCELLANEOUS) ×2 IMPLANT
CLEANER CAUTERY TIP 5X5 PAD (MISCELLANEOUS) IMPLANT
CLOTH BEACON ORANGE TIMEOUT ST (SAFETY) ×2 IMPLANT
COVER BACK TABLE 60X90IN (DRAPES) ×2 IMPLANT
COVER MAYO STAND STRL (DRAPES) ×2 IMPLANT
DISSECTOR ROUND CHERRY 3/8 STR (MISCELLANEOUS) IMPLANT
DRAIN PENROSE 18X1/4 LTX STRL (WOUND CARE) IMPLANT
DRAPE PED LAPAROTOMY (DRAPES) ×2 IMPLANT
ELECT REM PT RETURN 9FT ADLT (ELECTROSURGICAL)
ELECTRODE REM PT RTRN 9FT ADLT (ELECTROSURGICAL) IMPLANT
GLOVE BIO SURGEON STRL SZ8 (GLOVE) ×2 IMPLANT
GLOVE BIOGEL PI IND STRL 7.5 (GLOVE) ×2 IMPLANT
GLOVE BIOGEL PI INDICATOR 7.5 (GLOVE) ×2
GLOVE SURG SS PI 7.5 STRL IVOR (GLOVE) ×2 IMPLANT
GOWN STRL REUS W/ TWL LRG LVL3 (GOWN DISPOSABLE) ×1 IMPLANT
GOWN STRL REUS W/ TWL XL LVL3 (GOWN DISPOSABLE) ×1 IMPLANT
GOWN STRL REUS W/TWL LRG LVL3 (GOWN DISPOSABLE) ×4 IMPLANT
GOWN STRL REUS W/TWL XL LVL3 (GOWN DISPOSABLE) ×4 IMPLANT
NEEDLE HYPO 25X1 1.5 SAFETY (NEEDLE) IMPLANT
NS IRRIG 500ML POUR BTL (IV SOLUTION) ×2 IMPLANT
PACK BASIN DAY SURGERY FS (CUSTOM PROCEDURE TRAY) ×2 IMPLANT
PAD CLEANER CAUTERY TIP 5X5 (MISCELLANEOUS)
PENCIL BUTTON HOLSTER BLD 10FT (ELECTRODE) ×2 IMPLANT
SPONGE GAUZE 4X4 12PLY STER LF (GAUZE/BANDAGES/DRESSINGS) ×2 IMPLANT
SUPPORT SCROTAL LG STRP (MISCELLANEOUS) ×2 IMPLANT
SUT CHROMIC 3 0 SH 27 (SUTURE) ×6 IMPLANT
SUT ETHILON 3 0 PS 1 (SUTURE) IMPLANT
SUT SILK 4 0 TIES 17X18 (SUTURE) IMPLANT
SUT VIC AB 4-0 RB1 27 (SUTURE)
SUT VIC AB 4-0 RB1 27X BRD (SUTURE) IMPLANT
SUT VICRYL 6 0 RB 1 (SUTURE) IMPLANT
SYR CONTROL 10ML LL (SYRINGE) IMPLANT
TOWEL OR 17X24 6PK STRL BLUE (TOWEL DISPOSABLE) ×4 IMPLANT
TRAY DSU PREP LF (CUSTOM PROCEDURE TRAY) ×2 IMPLANT
TUBE CONNECTING 12X1/4 (SUCTIONS) ×2 IMPLANT
WATER STERILE IRR 3000ML UROMA (IV SOLUTION) IMPLANT
WATER STERILE IRR 500ML POUR (IV SOLUTION) ×2 IMPLANT
YANKAUER SUCT BULB TIP NO VENT (SUCTIONS) ×2 IMPLANT

## 2014-08-27 NOTE — Op Note (Signed)
PATIENT:  Joseph Navarro  PRE-OPERATIVE DIAGNOSIS: Left hydrocele  POST-OPERATIVE DIAGNOSIS:  Same  PROCEDURE:  Procedure(s): Left hydrocelectomy  SURGEON:  Garnett FarmMark C Presley Gora  INDICATION: Mr. Joseph Navarro is a 59 year old male with a large left hydrocele that is causing difficulty due to its large size. We discussed the treatment options and elected to proceed with hydrocelectomy.  ANESTHESIA:  General  EBL:  Minimal  DRAINS: None  LOCAL MEDICATIONS USED:  19 mL of 1/2% Marcaine with epinephrine  SPECIMEN:  None  DISPOSITION OF SPECIMEN:  N/A  Description of procedure: After informed consent the patient was brought to the major OR, placed on the table and administered general anesthesia. His genitalia was then sterilely prepped and draped. An official timeout was then performed.  A midline median raphae scrotal incision was then made and carried down over the left hydrocele. The tissue over the hydrocele was cleared using a combination of sharp and blunt technique. The hydrocele was then opened, drained of clear amber fluid and delivered through the incision. The excess hydrocele sac tissue was excised with the Bovie electrocautery and then I cauterized the edges. I then reapproximated the edges posteriorly behind the epididymis with a running, locking 3-0 chromic suture. The appendix testis was removed with the Bovie.   His testicle was then replaced in the normal anatomic position and his left hemiscrotum. I then closed the deep scrotal tissue with running 3-0 chromic suture in a locking fashion. I injected quarter percent Marcaine in the subcutaneous tissue and closed the skin with running 3-0 chromic. Neosporin, a sterile gauze dressing, fluff Kerlix and a scrotal support were applied. The patient tolerated the procedure well no intraoperative complications. Needle sponge and instrument counts were correct at the end of the operation.   PLAN OF CARE: Discharge to home after PACU  PATIENT  DISPOSITION:  PACU - hemodynamically stable.

## 2014-08-27 NOTE — Anesthesia Preprocedure Evaluation (Addendum)
Anesthesia Evaluation  Patient identified by MRN, date of birth, ID band Patient awake    Reviewed: Allergy & Precautions, NPO status , Patient's Chart, lab work & pertinent test results  Airway Mallampati: II  TM Distance: >3 FB Neck ROM: Full    Dental no notable dental hx.    Pulmonary neg pulmonary ROS,  breath sounds clear to auscultation  Pulmonary exam normal       Cardiovascular negative cardio ROS  Rhythm:Regular Rate:Normal     Neuro/Psych negative neurological ROS  negative psych ROS   GI/Hepatic negative GI ROS, Neg liver ROS,   Endo/Other  diabetes, Type 2  Renal/GU negative Renal ROS  negative genitourinary   Musculoskeletal negative musculoskeletal ROS (+)   Abdominal (+) + obese,   Peds negative pediatric ROS (+)  Hematology negative hematology ROS (+)   Anesthesia Other Findings   Reproductive/Obstetrics negative OB ROS                            Anesthesia Physical Anesthesia Plan  ASA: II  Anesthesia Plan: General   Post-op Pain Management:    Induction: Intravenous  Airway Management Planned: LMA  Additional Equipment:   Intra-op Plan:   Post-operative Plan: Extubation in OR  Informed Consent: I have reviewed the patients History and Physical, chart, labs and discussed the procedure including the risks, benefits and alternatives for the proposed anesthesia with the patient or authorized representative who has indicated his/her understanding and acceptance.   Dental advisory given  Plan Discussed with: CRNA  Anesthesia Plan Comments: (Glucose 247. Diet controlled. He states he checks his glucose at home and he is usually in decent control. Plan regular insulin 5 units SQ)       Anesthesia Quick Evaluation

## 2014-08-27 NOTE — H&P (Signed)
Joseph Navarro is a 59 year old male with a left hydrocele.   History of Present Illness The patient noted left scrotal swelling. A ultrasound performed on 05/05/14 revealed a simple hydrocele on the left hand side, a small right epididymal cyst and a scrotal pearl on the left.   He indicates that this has been present for a little over a year. It has been increasing in size. It does not cause any discomfort but is getting in the way and is quite large. It has begun to envelop his penis giving the appearance of decreased penile length. He does not recall any trauma nor has he had any inguinal surgery. He denies any voiding symptoms.  His hydrocele would be considered a moderate severity with no modifying factors or associated signs or symptoms.   Past Medical History Problems  1. History of No significant past medical history  Surgical History Problems  1. History of No Surgical Problems  Current Meds 1. No Reported Medications Recorded  Allergies Medication  1. No Known Drug Allergies  Family History Problems  1. Family history of No significant past medical history : Mother  Social History Problems    Denied: History of Alcohol use   Caffeine use (F15.90)   Never a smoker  Review of Systems Genitourinary, constitutional, skin, eye, otolaryngeal, hematologic/lymphatic, cardiovascular, pulmonary, endocrine, musculoskeletal, gastrointestinal, neurological and psychiatric system(s) were reviewed and pertinent findings if present are noted and are otherwise negative.  Genitourinary: scrotal swelling.    Vitals Vital Signs  Height: 5 ft 9 in Weight: 210 lb  BMI Calculated: 31.01 BSA Calculated: 2.11 Blood Pressure: 124 / 81 Temperature: 97.4 F Heart Rate: 83  Physical Exam Constitutional: Well nourished and well developed . No acute distress.  ENT:. The ears and nose are normal in appearance.  Neck: The appearance of the neck is normal and no neck mass is present.   Pulmonary: No respiratory distress and normal respiratory rhythm and effort.  Cardiovascular: Heart rate and rhythm are normal . No peripheral edema.  Abdomen: The abdomen is soft and nontender. No masses are palpated. No CVA tenderness. No hernias are palpable. No hepatosplenomegaly noted.  Genitourinary: Examination of the penis demonstrates no discharge, no masses, no lesions and a normal meatus. The penis is uncircumcised. The scrotum is without lesions. Examination of the left scrotum demostrates a hydrocele. The right epididymis is palpably normal and non-tender. The left spermatic cord is not palpable. The right testis is non-tender and without masses. The left testis is nonpalpable.  Lymphatics: The femoral and inguinal nodes are not enlarged or tender.  Skin: Normal skin turgor, no visible rash and no visible skin lesions.  Neuro/Psych:. Mood and affect are appropriate.    Results/Data Urine  COLOR YELLOW  APPEARANCE CLEAR  SPECIFIC GRAVITY >1.030  pH 5.0  GLUCOSE 500 mg/dL BILIRUBIN NEG  KETONE NEG mg/dL BLOOD NEG  PROTEIN TRACE mg/dL UROBILINOGEN 0.2 mg/dL NITRITE NEG  LEUKOCYTE ESTERASE NEG   The following clinical lab reports were reviewed:  UA: His urine was clear today.    Assessment   We discussed the options for treatment of his hydrocele which include aspiration versus surgery. I told him that I did not recommend aspiration as this would likely help in the short-term but in the long-term he would almost certainly recur. Surgical therapy would be curative and we discussed the procedure in detail including the infusion used, the probability of success, the outpatient nature of the procedure and the anticipated postoperative course. He  does have a fairly large hydrocele and therefore I did recommend the use of a Penrose drain postoperatively.   Plan   He will be scheduled for left hydrocelectomy.

## 2014-08-27 NOTE — Anesthesia Postprocedure Evaluation (Signed)
  Anesthesia Post-op Note  Patient: Joseph AdolphGeorge Navarro  Procedure(s) Performed: Procedure(s) (LRB): LEFT HYDROCELECTOMY ADULT (Left)  Patient Location: PACU  Anesthesia Type: General  Level of Consciousness: awake and alert   Airway and Oxygen Therapy: Patient Spontanous Breathing  Post-op Pain: mild  Post-op Assessment: Post-op Vital signs reviewed, Patient's Cardiovascular Status Stable, Respiratory Function Stable, Patent Airway and No signs of Nausea or vomiting  Last Vitals:  Filed Vitals:   08/27/14 1115  BP: 124/73  Pulse: 57  Temp:   Resp: 11    Post-op Vital Signs: stable   Complications: No apparent anesthesia complications

## 2014-08-27 NOTE — Transfer of Care (Signed)
Last Vitals:  Filed Vitals:   08/27/14 0824  BP: 131/76  Pulse: 69  Temp: 36.8 C  Resp: 16   Immediate Anesthesia Transfer of Care Note  Patient: Joseph Navarro  Procedure(s) Performed: Procedure(s) (LRB): LEFT HYDROCELECTOMY ADULT (Left)  Patient Location: PACU  Anesthesia Type: General  Level of Consciousness: awake, alert  and oriented  Airway & Oxygen Therapy: Patient Spontanous Breathing and Patient connected to nasal cannula oxygen  Post-op Assessment: Report given to PACU RN and Post -op Vital signs reviewed and stable  Post vital signs: Reviewed and stable  Complications: No apparent anesthesia complications

## 2014-08-27 NOTE — Discharge Instructions (Signed)
Scrotal surgery postoperative instructions ° °Wound: ° °In most cases your incision will have absorbable sutures that will dissolve within the first 10-20 days. Some will fall out even earlier. Expect some redness as the sutures dissolved but this should occur only around the sutures. If there is generalized redness, especially with increasing pain or swelling, let us know. The scrotum will very likely get "black and blue" as the blood in the tissues spread. Sometimes the whole scrotum will turn colors. The black and blue is followed by a yellow and brown color. In time, all the discoloration will go away. In some cases some firm swelling in the area of the testicle may persist for up to 4-6 weeks after the surgery and is considered normal in most cases. ° °Diet: ° °You may return to your normal diet within 24 hours following your surgery. You may note some mild nausea and possibly vomiting the first 6-8 hours following surgery. This is usually due to the side effects of anesthesia, and will disappear quite soon. I would suggest clear liquids and a very light meal the first evening following your surgery. ° °Activity: ° °Your physical activity should be restricted the first 48 hours. During that time you should remain relatively inactive, moving about only when necessary. During the first 7-10 days following surgery he should avoid lifting any heavy objects (anything greater than 15 pounds), and avoid strenuous exercise. If you work, ask us specifically about your restrictions, both for work and home. We will write a note to your employer if needed. ° °You should plan to wear a tight pair of jockey shorts or an athletic supporter for the first 4-5 days, even to sleep. This will keep the scrotum immobilized to some degree and keep the swelling down. ° °Ice packs should be placed on and off over the scrotum for the first 48 hours. Frozen peas or corn in a ZipLock bag can be frozen, used and re-frozen. Fifteen minutes  on and 15 minutes off is a reasonable schedule. The ice is a good pain reliever and keeps the swelling down. ° °Hygiene: ° °You may shower 48 hours after your surgery. Tub bathing should be restricted until the seventh day. ° ° ° ° ° ° ° ° ° °Medication: ° °You will be sent home with some type of pain medication. In many cases you will be sent home with a narcotic pain pill (hydrococone or oxycodone). If the pain is not too bad, you may take either Tylenol (acetaminophen) or Advil (ibuprofen) which contain no narcotic agents, and might be tolerated a little better, with fewer side effects. If the pain medication you are sent home with does not control the pain, you will have to let us know. Some narcotic pain medications cannot be given or refilled by a phone call to a pharmacy. ° °Problems you should report to us: ° °· Fever of 101.0 degrees Fahrenheit or greater. °· Moderate or severe swelling under the skin incision or involving the scrotum. °· Drug reaction such as hives, a rash, nausea or vomiting. °·  ° ° °Post Anesthesia Home Care Instructions ° °Activity: °Get plenty of rest for the remainder of the day. A responsible adult should stay with you for 24 hours following the procedure.  °For the next 24 hours, DO NOT: °-Drive a car °-Operate machinery °-Drink alcoholic beverages °-Take any medication unless instructed by your physician °-Make any legal decisions or sign important papers. ° °Meals: °Start with liquid foods such as   gelatin or soup. Progress to regular foods as tolerated. Avoid greasy, spicy, heavy foods. If nausea and/or vomiting occur, drink only clear liquids until the nausea and/or vomiting subsides. Call your physician if vomiting continues. ° °Special Instructions/Symptoms: °Your throat may feel dry or sore from the anesthesia or the breathing tube placed in your throat during surgery. If this causes discomfort, gargle with warm salt water. The discomfort should disappear within 24  hours. ° °If you had a scopolamine patch placed behind your ear for the management of post- operative nausea and/or vomiting: ° °1. The medication in the patch is effective for 72 hours, after which it should be removed.  Wrap patch in a tissue and discard in the trash. Wash hands thoroughly with soap and water. °2. You may remove the patch earlier than 72 hours if you experience unpleasant side effects which may include dry mouth, dizziness or visual disturbances. °3. Avoid touching the patch. Wash your hands with soap and water after contact with the patch. °  ° °

## 2014-08-30 ENCOUNTER — Encounter (HOSPITAL_BASED_OUTPATIENT_CLINIC_OR_DEPARTMENT_OTHER): Payer: Self-pay | Admitting: Urology

## 2017-02-11 ENCOUNTER — Encounter (HOSPITAL_COMMUNITY): Payer: Self-pay | Admitting: Family Medicine

## 2017-02-11 ENCOUNTER — Emergency Department (HOSPITAL_COMMUNITY): Payer: PRIVATE HEALTH INSURANCE

## 2017-02-11 ENCOUNTER — Emergency Department (HOSPITAL_COMMUNITY)
Admission: EM | Admit: 2017-02-11 | Discharge: 2017-02-11 | Disposition: A | Discharge status: Home / Self Care | Payer: PRIVATE HEALTH INSURANCE | Attending: Emergency Medicine | Admitting: Emergency Medicine

## 2017-02-11 DIAGNOSIS — G8929 Other chronic pain: Secondary | ICD-10-CM | POA: Insufficient documentation

## 2017-02-11 DIAGNOSIS — M25562 Pain in left knee: Secondary | ICD-10-CM | POA: Insufficient documentation

## 2017-02-11 DIAGNOSIS — E119 Type 2 diabetes mellitus without complications: Secondary | ICD-10-CM | POA: Insufficient documentation

## 2017-02-11 MED ORDER — NAPROXEN 375 MG PO TABS: 375.0000 mg | ORAL_TABLET | Freq: Two times a day (BID) | ORAL | 0 refills | Status: DC

## 2017-02-11 MED ORDER — LIDOCAINE-MENTHOL 3.99-1.25 % EX PTCH: 1.0000 | MEDICATED_PATCH | Freq: Two times a day (BID) | CUTANEOUS | 0 refills | Status: DC

## 2017-02-11 NOTE — ED Triage Notes (Signed)
Patient is complaining of left knee pain that started about 6 months ago with no injuries. Patient is requesting a CORTIZONE shot. Patient is ambulatory.

## 2017-02-11 NOTE — ED Provider Notes (Signed)
[facility] DEPT Provider Note   CSN: [id number] Arrival date & time: 02/11/17  1634     History   Chief Complaint Chief Complaint  Patient presents with  . Knee Pain    HPI Joseph Navarro is a 61 y.o. male.  HPI 61 year old African-American male past medical history significant for diabetes and chronic left knee pain presents to the emergency Department today with complaints of left knee pain. The patient states that many pain started approximately 6 months ago with no injuries. States he was seen by his primary care doctor who states that he has arthritis and possibly needs a steroid injection. States she was given medication by his primary care doctor but unsure what the medication was. The patient states this is not helping his pain. He was told that he get a steroid injection the ED. Patient denies any new injury. Denies any associated fevers, redness, edema, paresthesias, weakness. Ambulation and movement make the pain worse. Nothing makes the pain better. Patient has not taken today for his pain prior to arrival. Past Medical History:  Diagnosis Date  . Left hydrocele   . Type 2 diabetes, diet controlled (HCC)     There are no active problems to display for this patient.   Past Surgical History:  Procedure Laterality Date  . HYDROCELE EXCISION Left 08/27/2014   Procedure: LEFT HYDROCELECTOMY ADULT;  Surgeon: Ihor Gully, MD;  Location: [facility];  Service: Urology;  Laterality: Left;  . ORIF RIGHT ANKLE FX  10-22-2004  . REMOVAL SYNDESMOTIC SCREW RIGHT ANKLE  12-26-2004       Home Medications    Prior to Admission medications   Medication Sig Start Date End Date Taking? Authorizing Provider  Lidocaine-Menthol [facility] PAIN RELIEF) 3.99-1.25 % [facility] Apply 1 patch topically 2 (two) times daily. 02/11/17   Rise Mu, PA-C  naproxen (NAPROSYN) 375 MG tablet Take 1 tablet (375 mg total) by mouth 2 (two) times daily. 02/11/17   Rise Mu, PA-C  Oxycodone HCl 10 MG TABS Take 1 tablet (10 mg total) by mouth every 4 (four) hours as needed. 08/27/14   Ihor Gully, MD    Family History History reviewed. No pertinent family history.  Social History Social History  Substance Use Topics  . Smoking status: Never Smoker  . Smokeless tobacco: Never Used  . Alcohol use No     Allergies   Patient has no known allergies.   Review of Systems Review of Systems  Constitutional: Negative for chills and fever.  Musculoskeletal: Positive for arthralgias and gait problem. Negative for joint swelling.  Skin: Negative for color change.  Neurological: Negative for weakness and numbness.     Physical Exam Updated Vital Signs BP (!) 161/93 (BP Location: Right Arm)   Pulse 61   Temp 98.3 F (36.8 C) (Oral)   Resp 16   Ht  (1.753 m)   Wt 73.9 kg (163 lb)   SpO2 100%   BMI 24.07 kg/m   Physical Exam  Constitutional: He appears well-developed and well-nourished. No distress.  HENT:  Head: Normocephalic and atraumatic.  Eyes: Right eye exhibits no discharge. Left eye exhibits no discharge. No scleral icterus.  Neck: Normal range of motion.  Pulmonary/Chest: No respiratory distress.  Musculoskeletal:       Left knee: He exhibits decreased range of motion. He exhibits no swelling, no effusion, no ecchymosis, no deformity, no laceration, no erythema, normal alignment, no LCL laxity, normal patellar mobility, no bony tenderness,  normal meniscus and no MCL laxity. Tenderness found. LCL and patellar tendon tenderness noted. No medial joint line, no lateral joint line and no MCL tenderness noted.  DP pulses are 2+ bilaterally. Sensation intact. Cap refill normal. No erythema or warmth of the joint.  Neurological: He is alert.  Skin: No pallor.  Psychiatric: His behavior is normal. Judgment and thought content normal.  Nursing note and vitals reviewed.    ED Treatments / Results  Labs (all labs ordered are  listed, but only abnormal results are displayed) Labs Reviewed - No data to display  EKG  EKG Interpretation None       Radiology Dg Knee Complete 4 Views Left  Result Date: 02/11/2017 CLINICAL DATA:  Chronic LEFT knee pain.  History of diabetes. EXAM: LEFT KNEE - COMPLETE 4+ VIEW COMPARISON:  LEFT tibia fibula March 08, 2013 FINDINGS: Mild tricompartmental marginal spurring, mild medial and patellofemoral compartment narrowing. No fracture deformity. No dislocation. No destructive bony lesions. Soft tissue planes are nonsuspicious. IMPRESSION: No acute osseous process. Mild tricompartmental osteoarthrosis. Electronically Signed   By: Awilda Metro M.D.   On: 02/11/2017 19:00    Procedures Procedures (including critical care time)  Medications Ordered in ED Medications - No data to display   Initial Impression / Assessment and Plan / ED Course  I have reviewed the triage vital signs and the nursing notes.  Pertinent labs & imaging results that were available during my care of the patient were reviewed by me and considered in my medical decision making (see chart for details).     Patient presents with acute on chronic left knee pain. Patient is requesting cortisone injection. Discussed with patient that we do not do this in the ED. Will follow up with orthopedics. Neurovascularly intact. No signs of septic arthritis or gout. Patient X-Ray negative for obvious fracture or dislocation. Pain managed in ED. Pt advised to follow up with orthopedics if symptoms persist for possibility of missed fracture diagnosis. Patient given brace while in ED, conservative therapy recommended and discussed. Patient will be dc home & is agreeable with above plan.  Patient's blood pressure elevated in the ED. Encourage patient follow up with PCP for blood pressure recheck to make sure he is not on any blood pressure medicine. Denies any associated symptoms of chest pain, shortness of breath,  headache. No further medical intervention at this time.   Final Clinical Impressions(s) / ED Diagnoses   Final diagnoses:  Chronic pain of left knee    New Prescriptions New Prescriptions   LIDOCAINE-MENTHOL [facility] PAIN RELIEF) 3.99-1.25 % PT[facility]    Apply 1 patch topically 2 (two) times daily.   NAPROXEN (NAPROSYN) 375 MG TABLET    Take 1 tablet (375 mg total) by mouth 2 (two) times daily.     Wallace Keller 02/11/17 2125    Jacalyn Lefevre, MD 02/11/17 2230

## 2017-02-11 NOTE — Discharge Instructions (Signed)
Used lidocaine patches.  Please take the Naproxen as prescribed for pain. Do not take any additional NSAIDs including Motrin, Aleve, Ibuprofen, Advil.  Please rest, ice, compress and elevated the affected body part to help with swelling and pain.  Follow-up with orthopedist.

## 2018-01-10 ENCOUNTER — Encounter (HOSPITAL_COMMUNITY): Payer: Self-pay

## 2018-01-10 ENCOUNTER — Emergency Department (HOSPITAL_COMMUNITY)
Admission: EM | Admit: 2018-01-10 | Discharge: 2018-01-10 | Disposition: A | Discharge status: Home / Self Care | Payer: PRIVATE HEALTH INSURANCE | Attending: Emergency Medicine | Admitting: Emergency Medicine

## 2018-01-10 ENCOUNTER — Other Ambulatory Visit: Payer: Self-pay

## 2018-01-10 DIAGNOSIS — Z79899 Other long term (current) drug therapy: Secondary | ICD-10-CM | POA: Insufficient documentation

## 2018-01-10 DIAGNOSIS — G8929 Other chronic pain: Secondary | ICD-10-CM | POA: Insufficient documentation

## 2018-01-10 DIAGNOSIS — E119 Type 2 diabetes mellitus without complications: Secondary | ICD-10-CM | POA: Insufficient documentation

## 2018-01-10 DIAGNOSIS — M25562 Pain in left knee: Secondary | ICD-10-CM | POA: Insufficient documentation

## 2018-01-10 MED ORDER — HYDROCODONE-ACETAMINOPHEN 5-325 MG PO TABS: 1.0000 | ORAL_TABLET | Freq: Four times a day (QID) | ORAL | 0 refills | Status: DC | PRN

## 2018-01-10 NOTE — ED Notes (Signed)
ED Provider at bedside. 

## 2018-01-10 NOTE — ED Triage Notes (Signed)
Per pt Patient complaining that he has not slept in several days due to "hard situation with the apartment." Patient also states that he has felt depressed. States "im done with it, have thoughts of ending it", no thoughts of harming others when asked, and denies SI. Patient has appointment with [facility] next Tuesday for his knee. Pain in his knee was 10/10. Patient states that the pain in his knee started roughly at 2324.   ZT

## 2018-01-10 NOTE — ED Provider Notes (Addendum)
[facility] Provider Note: Joseph DellJ. Lane Lelania Bia, MD, FACEP  CSN: [id number] MRN: [id number] ARRIVAL: 01/10/18 at 0013 ROOM: WA02/WA02   CHIEF COMPLAINT  Knee Pain   HISTORY OF PRESENT ILLNESS  01/10/18 2:54 AM Joseph Navarro is a 62 y.o. male with chronic left knee pain.  He has had injections in that knee without adequate relief in the past.  His pain acutely worsened yesterday and he now rates it as a 10 out of 10, worse with extremes of flexion and extension.  He denies acute injury.  There is no associated swelling.  [facility] but is requesting something for pain in the meantime.  He has taken Advil without relief.  He admits to being depressed due to recent life stressors but denies SI.   Past Medical History:  Diagnosis Date  . Left hydrocele   . Type 2 diabetes, diet controlled (HCC)     Past Surgical History:  Procedure Laterality Date  . HYDROCELE EXCISION Left 08/27/2014   Procedure: LEFT HYDROCELECTOMY ADULT;  Surgeon: Ihor GullyMark Ottelin, MD;  [facility] [place];  Service: Urology;  Laterality: Left;  . ORIF RIGHT ANKLE FX  10-22-2004  . REMOVAL SYNDESMOTIC SCREW RIGHT ANKLE  12-26-2004    History reviewed. No pertinent family history.  Social History   Tobacco Use  . Smoking status: Never Smoker  . Smokeless tobacco: Never Used  Substance Use Topics  . Alcohol use: No  . Drug use: No    Prior to Admission medications   Medication Sig Start Date End Date Taking? Authorizing Provider  Lidocaine-Menthol [facility](LIDOPATCH PAIN RELIEF) 3.99-1.25 % PT[facility] Apply 1 patch topically 2 (two) times daily. Patient not taking: Reported on 01/10/2018 02/11/17   Demetrios LollLeaphart, Kenneth T, PA-C  naproxen (NAPROSYN) 375 MG tablet Take 1 tablet (375 mg total) by mouth 2 (two) times daily. Patient not taking: Reported on 01/10/2018 02/11/17   Demetrios LollLeaphart, Kenneth T, PA-C  Oxycodone HCl 10 MG TABS Take 1 tablet (10 mg total) by mouth every 4 (four)  hours as needed. Patient not taking: Reported on 01/10/2018 08/27/14   Ihor Gullyttelin, Mark, MD    Allergies Patient has no known allergies.   REVIEW OF SYSTEMS  Negative except as noted here or in the History of Present Illness.   PHYSICAL EXAMINATION  Initial Vital Signs Blood pressure (!) 161/98, pulse 68, temperature 97.6 F (36.4 C), resp. rate 16, height 5\' 9"  (1.753 m), weight 83.9 kg, SpO2 98 %.  Examination General: Well-developed, well-nourished male in no acute distress; appearance consistent with age of record HENT: normocephalic; atraumatic Eyes: pupils equal, round and reactive to light; extraocular muscles intact Neck: supple Heart: regular rate and rhythm Lungs: clear to auscultation bilaterally Abdomen: soft; nondistended; nontender; bowel sounds present Extremities: No deformity; full range of motion; pulses normal; pain in left knee on extremes of extension and flexion without effusion, erythema or warmth Neurologic: Awake, alert and oriented; motor function intact in all extremities and symmetric; no facial droop Skin: Warm and dry Psychiatric: Depressed mood; no SI; no HI   RESULTS  Summary of this visit's results, reviewed by myself:   EKG Interpretation  Date/Time:    Ventricular Rate:    PR Interval:    QRS Duration:   QT Interval:    QTC Calculation:   R Axis:     Text Interpretation:        Laboratory Studies: No results found for this or any previous visit (  from the past 24 hour(s)). Imaging Studies: No results found.  ED COURSE and MDM  Nursing notes and initial vitals signs, including pulse oximetry, reviewed.  Vitals:   01/10/18 0021 01/10/18 0026 01/10/18 0027  BP: (!) 161/98 (!) 161/98   Pulse: 73 68   Resp: 16 16   Temp: 97.6 F (36.4 C) 97.6 F (36.4 C)   TempSrc: Oral    SpO2: 99% 98%   Weight:   83.9 kg  Height:   5\' 9"  (1.753 m)   Consultation with the [place] state controlled substances database reveals the patient  has received 1 prescription for tramadol in the past 2 years.   PROCEDURES    ED DIAGNOSES     ICD-10-CM   1. Chronic pain of left knee M25.562    G89.29        Tavares Levinson, Jonny Ruiz, MD 01/10/18 9562    Paula Libra, MD 01/10/18 [phone]

## 2018-02-20 ENCOUNTER — Ambulatory Visit (INDEPENDENT_AMBULATORY_CARE_PROVIDER_SITE_OTHER): Payer: Self-pay | Admitting: Physician Assistant

## 2018-02-20 ENCOUNTER — Encounter

## 2018-08-06 DIAGNOSIS — E119 Type 2 diabetes mellitus without complications: Secondary | ICD-10-CM | POA: Insufficient documentation

## 2018-08-06 DIAGNOSIS — M25552 Pain in left hip: Secondary | ICD-10-CM | POA: Insufficient documentation

## 2018-08-06 DIAGNOSIS — G8929 Other chronic pain: Secondary | ICD-10-CM | POA: Insufficient documentation

## 2018-09-12 DIAGNOSIS — M1612 Unilateral primary osteoarthritis, left hip: Secondary | ICD-10-CM | POA: Insufficient documentation

## 2018-10-03 ENCOUNTER — Other Ambulatory Visit (HOSPITAL_COMMUNITY): Payer: Self-pay

## 2018-10-10 ENCOUNTER — Inpatient Hospital Stay (HOSPITAL_COMMUNITY): Admission: RE | Admit: 2018-10-10 | Payer: Self-pay | Source: Home / Self Care | Admitting: Orthopedic Surgery

## 2018-10-10 ENCOUNTER — Encounter (HOSPITAL_COMMUNITY): Admission: RE | Payer: Self-pay | Source: Home / Self Care

## 2018-10-10 SURGERY — ARTHROPLASTY, HIP, TOTAL,POSTERIOR APPROACH
Anesthesia: Choice | Laterality: Left

## 2019-01-29 DIAGNOSIS — M1611 Unilateral primary osteoarthritis, right hip: Secondary | ICD-10-CM | POA: Insufficient documentation

## 2019-07-24 DIAGNOSIS — M7582 Other shoulder lesions, left shoulder: Secondary | ICD-10-CM | POA: Insufficient documentation

## 2019-07-28 NOTE — Congregational Nurse Program (Signed)
  Dept: [facility] Nurse Program Note  Date of Encounter: 07/28/2019  Past Medical History: Past Medical History:  Diagnosis Date  . Left hydrocele   . Type 2 diabetes, diet controlled ([place])     Encounter Details: CNP Questionnaire - 07/28/19 1412      Questionnaire   Patient Status  Not Applicable    Race  Black or African American    Location Patient Served At  [place]    Uninsured  Not Applicable    Food  No food insecurities    Housing/Utilities  No permanent housing    Transportation  No transportation needs    Interpersonal Safety  Yes, feel physically and emotionally safe where you currently live    Medication  No medication insecurities    Medical Provider  Yes    Referrals  Dental    ED Visit Averted  Not Applicable    Life-Saving Intervention Made  Not Applicable     Initial visit to see nurse .Requesting dental services ,nurse counseled regarding resources available to him to meet his needs . Has a PCP and feels his medical needs are being met.had hip surgery 8 months ago ,doing well ,works and working on housing needs.  Nurse will check on new services offered by [facility] and give information to client. He has no dental insurance,? [employer] card eligibility due to income but will check . Client in a hurry as he had to leave ,will follow up next week .

## 2019-08-04 ENCOUNTER — Telehealth: Payer: Self-pay

## 2019-08-04 NOTE — Telephone Encounter (Signed)
Nurse made an appointment at [facility] located at [facility] for 11-13-2019 using opening coupon .Client will receive exam, establish plan of care and if any treatments are needed as he has no dental insurance and was requesting a cleaning.. Client will decide if he can continue with care beyond that based on cost . He has medical insurance ,no dental and works . Client to cancel if he cannot keep appointment.

## 2019-10-01 ENCOUNTER — Ambulatory Visit: Payer: Self-pay | Admitting: Physician Assistant

## 2019-10-01 ENCOUNTER — Other Ambulatory Visit: Payer: Self-pay

## 2019-10-01 VITALS — BP 143/88 | HR 77 | Temp 98.2°F | Resp 18 | Ht 71.0 in

## 2019-10-01 DIAGNOSIS — M2042 Other hammer toe(s) (acquired), left foot: Secondary | ICD-10-CM | POA: Insufficient documentation

## 2019-10-01 DIAGNOSIS — Z8639 Personal history of other endocrine, nutritional and metabolic disease: Secondary | ICD-10-CM

## 2019-10-01 DIAGNOSIS — E1142 Type 2 diabetes mellitus with diabetic polyneuropathy: Secondary | ICD-10-CM

## 2019-10-01 DIAGNOSIS — B351 Tinea unguium: Secondary | ICD-10-CM

## 2019-10-01 DIAGNOSIS — M2041 Other hammer toe(s) (acquired), right foot: Secondary | ICD-10-CM

## 2019-10-01 DIAGNOSIS — B353 Tinea pedis: Secondary | ICD-10-CM

## 2019-10-01 DIAGNOSIS — I10 Essential (primary) hypertension: Secondary | ICD-10-CM | POA: Insufficient documentation

## 2019-10-01 LAB — POCT GLYCOSYLATED HEMOGLOBIN (HGB A1C): Hemoglobin A1C: 7.4 % — AB (ref 4.0–5.6)

## 2019-10-01 NOTE — Progress Notes (Signed)
New Patient Office Visit  Subjective:  Patient ID: Joseph Navarro, male    DOB: 1956-02-16  Age: 64 y.o. MRN: [id number]  CC:  Chief Complaint  Patient presents with  . Numbness    HPI Joseph Navarro reports that he has been having numbness in his right and left fingers, on the bottom of both feet.  States this has been ongoing, states that he was started on gabapentin by his primary care provider a few weeks ago, states it is offering some relief, but feels that should have resolved by now.  Reports that he is also having issues with dry itchy skin on both feet, toenail fungus, ingrown toenails, and hammertoes.  Reports that he was seen by podiatry in February 2021 and was given Lotrisone.  Reports he has used it intermittently without much relief.  Reports he is also been using peroxide on his feet without much relief.   Past Medical History:  Diagnosis Date  . Left hydrocele   . Type 2 diabetes, diet controlled ([place])     Past Surgical History:  Procedure Laterality Date  . HYDROCELE EXCISION Left 08/27/2014   Procedure: LEFT HYDROCELECTOMY ADULT;  Surgeon: Kathie Rhodes, MD;  Location: [facility];  Service: Urology;  Laterality: Left;  . ORIF RIGHT ANKLE FX  10-22-2004  . REMOVAL SYNDESMOTIC SCREW RIGHT ANKLE  12-26-2004    No family history on file.  Social History   Socioeconomic History  . Marital status: Single    Spouse name: Not on file  . Number of children: Not on file  . Years of education: Not on file  . Highest education level: Not on file  Occupational History  . Not on file  Tobacco Use  . Smoking status: Never Smoker  . Smokeless tobacco: Never Used  Substance and Sexual Activity  . Alcohol use: No  . Drug use: No  . Sexual activity: Not on file  Other Topics Concern  . Not on file  Social History Narrative  . Not on file   Social Determinants of Health   Financial Resource Strain:   . Difficulty of Paying Living Expenses:    Food Insecurity:   . Worried About Charity fundraiser in the Last Year:   . Arboriculturist in the Last Year:   Transportation Needs:   . Film/video editor (Medical):   Marland Kitchen Lack of Transportation (Non-Medical):   Physical Activity:   . Days of Exercise per Week:   . Minutes of Exercise per Session:   Stress:   . Feeling of Stress :   Social Connections:   . Frequency of Communication with Friends and Family:   . Frequency of Social Gatherings with Friends and Family:   . Attends Religious Services:   . Active Member of Clubs or Organizations:   . Attends Archivist Meetings:   Marland Kitchen Marital Status:   Intimate Partner Violence:   . Fear of Current or Ex-Partner:   . Emotionally Abused:   Marland Kitchen Physically Abused:   . Sexually Abused:     ROS Review of Systems  Constitutional: Negative.   HENT: Negative.   Eyes: Negative.   Respiratory: Negative.   Cardiovascular: Negative.   Gastrointestinal: Negative.   Endocrine: Negative.   Genitourinary: Negative.   Musculoskeletal: Positive for arthralgias.  Skin: Negative.   Allergic/Immunologic: Negative.   Neurological: Negative.   Hematological: Negative.   Psychiatric/Behavioral: Negative.     Objective:   Today's Vitals:  BP (!) 143/88 (BP Location: Left Arm, Patient Position: Sitting, Cuff Size: Normal)   Pulse 77   Temp 98.2 F (36.8 C) (Oral)   Resp 18   Ht 5' 11" (1.803 m)   SpO2 96%   BMI 25.80 kg/m   Physical Exam Constitutional:      General: He is not in acute distress.    Appearance: Normal appearance. He is not ill-appearing.  HENT:     Head: Normocephalic and atraumatic.     Right Ear: External ear normal.     Left Ear: External ear normal.     Mouth/Throat:     Mouth: Mucous membranes are dry.     Pharynx: Oropharynx is clear.  Eyes:     Extraocular Movements: Extraocular movements intact.     Conjunctiva/sclera: Conjunctivae normal.     Pupils: Pupils are equal, round, and reactive to  light.  Cardiovascular:     Rate and Rhythm: Normal rate and regular rhythm.     Pulses: Normal pulses.     Heart sounds: Normal heart sounds.  Pulmonary:     Effort: Pulmonary effort is normal.     Breath sounds: Normal breath sounds.  Abdominal:     General: Abdomen is flat. Bowel sounds are normal.     Palpations: Abdomen is soft.  Musculoskeletal:        General: Normal range of motion.     Cervical back: Normal range of motion and neck supple.     Right lower leg: No edema.     Left lower leg: No edema.  Feet:     Right foot:     Skin integrity: Callus and dry skin present.     Toenail Condition: Right toenails are abnormally thick. Fungal disease present.    Left foot:     Skin integrity: Callus and dry skin present.     Toenail Condition: Left toenails are abnormally thick. Fungal disease present.    Comments: Hammertoes noted bilaterally Skin:    General: Skin is warm and dry.  Neurological:     General: No focal deficit present.     Mental Status: He is alert and oriented to person, place, and time.  Psychiatric:        Mood and Affect: Mood normal.        Behavior: Behavior normal.        Thought Content: Thought content normal.        Judgment: Judgment normal.     Assessment & Plan:   Problem List Items Addressed This Visit      Endocrine   Diabetes mellitus type II, controlled ([place]) - Primary   Relevant Medications   aspirin 325 MG EC tablet   losartan (COZAAR) 50 MG tablet   metFORMIN (GLUCOPHAGE) 500 MG tablet    Other Visit Diagnoses    History of elevated glucose       Relevant Orders   HgB A1c (Completed)   Essential hypertension       Relevant Medications   aspirin 325 MG EC tablet   losartan (COZAAR) 50 MG tablet   Tinea pedis of both feet       Relevant Medications   clotrimazole-betamethasone (LOTRISONE) cream   Onychomycosis       Relevant Medications   clotrimazole-betamethasone (LOTRISONE) cream   Hammertoe, bilateral           Outpatient Encounter Medications as of 10/01/2019  Medication Sig  . aspirin 325 MG EC tablet TAKE 1  TABLET BY MOUTH TWICE A DAY  . Blood Glucose Monitoring Suppl (FIFTY50 GLUCOSE METER 2.0) w/Device KIT Use as instructed  . capsaicin (ZOSTRIX) 0.025 % cream Using gloves apply to feet daily, avoid in between toes.  . ferrous sulfate 325 (65 FE) MG EC tablet Take by mouth.  . gabapentin (NEURONTIN) 300 MG capsule Take by mouth.  . losartan (COZAAR) 50 MG tablet Take by mouth.  . metFORMIN (GLUCOPHAGE) 500 MG tablet Take by mouth.  . clotrimazole-betamethasone (LOTRISONE) cream APPLY TOPICALLY 2 TIMES DAILY FOR 30 DAYS.  . HYDROcodone-acetaminophen (NORCO) 5-325 MG tablet Take 1 tablet by mouth every 6 (six) hours as needed (for pain). (Patient not taking: Reported on 10/01/2019)   No facility-administered encounter medications on file as of 10/01/2019.  1. Controlled type 2 diabetes mellitus with diabetic polyneuropathy, without long-term current use of insulin ([place]) Patient was seen by his primary care provider on September 17, 2019, was started on gabapentin 300 mg 3 times a day during that visit.  Encourage patient to follow-up with primary care provider to consider increase.  Patient education given on keeping better control and reducing his overall blood glucose.  2. History of elevated glucose Patients last A1C was 6.9, today 7.4 - HgB A1c  3. Essential hypertension Continue management by primary care provider  4. Tinea pedis of both feet Encourage patient to use cream prescribed by podiatrist as prescribed, follow-up with podiatrist for further evaluation  5. Onychomycosis   6. Hammertoe, bilateral   I have reviewed the patient's medical history (PMH, PSH, Social History, Family History, Medications, and allergies) , and have been updated if relevant. I spent 30 minutes reviewing chart and  face to face time with patient.      Follow-up: Return if symptoms worsen or fail to  improve.   Loraine Grip Mayers, PA-C

## 2019-10-01 NOTE — Patient Instructions (Addendum)
Vit D 2,000 units over the counter    Diabetes Mellitus and Nutrition, Adult When you have diabetes (diabetes mellitus), it is very important to have healthy eating habits because your blood sugar (glucose) levels are greatly affected by what you eat and drink. Eating healthy foods in the appropriate amounts, at about the same times every day, can help you:  Control your blood glucose.  Lower your risk of heart disease.  Improve your blood pressure.  Reach or maintain a healthy weight. Every person with diabetes is different, and each person has different needs for a meal plan. Your health care provider may recommend that you work with a diet and nutrition specialist (dietitian) to make a meal plan that is best for you. Your meal plan may vary depending on factors such as:  The calories you need.  The medicines you take.  Your weight.  Your blood glucose, blood pressure, and cholesterol levels.  Your activity level.  Other health conditions you have, such as heart or kidney disease. How do carbohydrates affect me? Carbohydrates, also called carbs, affect your blood glucose level more than any other type of food. Eating carbs naturally raises the amount of glucose in your blood. Carb counting is a method for keeping track of how many carbs you eat. Counting carbs is important to keep your blood glucose at a healthy level, especially if you use insulin or take certain oral diabetes medicines. It is important to know how many carbs you can safely have in each meal. This is different for every person. Your dietitian can help you calculate how many carbs you should have at each meal and for each snack. Foods that contain carbs include:  Bread, cereal, rice, pasta, and crackers.  Potatoes and corn.  Peas, beans, and lentils.  Milk and yogurt.  Fruit and juice.  Desserts, such as cakes, cookies, ice cream, and candy. How does alcohol affect me? Alcohol can cause a sudden decrease  in blood glucose (hypoglycemia), especially if you use insulin or take certain oral diabetes medicines. Hypoglycemia can be a life-threatening condition. Symptoms of hypoglycemia (sleepiness, dizziness, and confusion) are similar to symptoms of having too much alcohol. If your health care provider says that alcohol is safe for you, follow these guidelines:  Limit alcohol intake to no more than 1 drink per day for nonpregnant women and 2 drinks per day for men. One drink equals 12 oz of beer, 5 oz of wine, or 1 oz of hard liquor.  Do not drink on an empty stomach.  Keep yourself hydrated with water, diet soda, or unsweetened iced tea.  Keep in mind that regular soda, juice, and other mixers may contain a lot of sugar and must be counted as carbs. What are tips for following this plan?  Reading food labels  Start by checking the serving size on the "Nutrition Facts" label of packaged foods and drinks. The amount of calories, carbs, fats, and other nutrients listed on the label is based on one serving of the item. Many items contain more than one serving per package.  Check the total grams (g) of carbs in one serving. You can calculate the number of servings of carbs in one serving by dividing the total carbs by 15. For example, if a food has 30 g of total carbs, it would be equal to 2 servings of carbs.  Check the number of grams (g) of saturated and trans fats in one serving. Choose foods that have low or  no amount of these fats.  Check the number of milligrams (mg) of salt (sodium) in one serving. Most people should limit total sodium intake to less than 2,300 mg per day.  Always check the nutrition information of foods labeled as "low-fat" or "nonfat". These foods may be higher in added sugar or refined carbs and should be avoided.  Talk to your dietitian to identify your daily goals for nutrients listed on the label. Shopping  Avoid buying canned, premade, or processed foods. These  foods tend to be high in fat, sodium, and added sugar.  Shop around the outside edge of the grocery store. This includes fresh fruits and vegetables, bulk grains, fresh meats, and fresh dairy. Cooking  Use low-heat cooking methods, such as baking, instead of high-heat cooking methods like deep frying.  Cook using healthy oils, such as olive, canola, or sunflower oil.  Avoid cooking with butter, cream, or high-fat meats. Meal planning  Eat meals and snacks regularly, preferably at the same times every day. Avoid going long periods of time without eating.  Eat foods high in fiber, such as fresh fruits, vegetables, beans, and whole grains. Talk to your dietitian about how many servings of carbs you can eat at each meal.  Eat 4-6 ounces (oz) of lean protein each day, such as lean meat, chicken, fish, eggs, or tofu. One oz of lean protein is equal to: ? 1 oz of meat, chicken, or fish. ? 1 egg. ?  cup of tofu.  Eat some foods each day that contain healthy fats, such as avocado, nuts, seeds, and fish. Lifestyle  Check your blood glucose regularly.  Exercise regularly as told by your health care provider. This may include: ? 150 minutes of moderate-intensity or vigorous-intensity exercise each week. This could be brisk walking, biking, or water aerobics. ? Stretching and doing strength exercises, such as yoga or weightlifting, at least 2 times a week.  Take medicines as told by your health care provider.  Do not use any products that contain nicotine or tobacco, such as cigarettes and e-cigarettes. If you need help quitting, ask your health care provider.  Work with a Veterinary surgeon or diabetes educator to identify strategies to manage stress and any emotional and social challenges. Questions to ask a health care provider  Do I need to meet with a diabetes educator?  Do I need to meet with a dietitian?  What number can I call if I have questions?  When are the best times to check my  blood glucose? Where to find more information:  American Diabetes Association: diabetes.org  Academy of Nutrition and Dietetics: www.eatright.[employer] of Diabetes and Digestive and Kidney Diseases (NIH): CarFlippers.tn Summary  A healthy meal plan will help you control your blood glucose and maintain a healthy lifestyle.  Working with a diet and nutrition specialist (dietitian) can help you make a meal plan that is best for you.  Keep in mind that carbohydrates (carbs) and alcohol have immediate effects on your blood glucose levels. It is important to count carbs and to use alcohol carefully. This information is not intended to replace advice given to you by your health care provider. Make sure you discuss any questions you have with your health care provider. Document Revised: 04/19/2017 Document Reviewed: 06/11/2016 Elsevier Patient Education  2020 Elsevier Inc.   Hip Exercises Ask your health care provider which exercises are safe for you. Do exercises exactly as told by your health care provider and adjust them as  directed. It is normal to feel mild stretching, pulling, tightness, or discomfort as you do these exercises. Stop right away if you feel sudden pain or your pain gets worse. Do not begin these exercises until told by your health care provider. Stretching and range-of-motion exercises These exercises warm up your muscles and joints and improve the movement and flexibility of your hip. These exercises also help to relieve pain, numbness, and tingling. You may be asked to limit your range of motion if you had a hip replacement. Talk to your health care provider about these restrictions. Hamstrings, supine  1. Lie on your back (supine position). 2. Loop a belt or towel over the ball of your left / right foot. The ball of your foot is on the walking surface, right under your toes. 3. Straighten your left / right knee and slowly pull on the belt or towel to  raise your leg until you feel a gentle stretch behind your knee (hamstring). ? Do not let your knee bend while you do this. ? Keep your other leg flat on the floor. 4. Hold this position for __________ seconds. 5. Slowly return your leg to the starting position. Repeat __________ times. Complete this exercise __________ times a day. Hip rotation  1. Lie on your back on a firm surface. 2. With your left / right hand, gently pull your left / right knee toward the shoulder that is on the same side of the body. Stop when your knee is pointing toward the ceiling. 3. Hold your left / right ankle with your other hand. 4. Keeping your knee steady, gently pull your left / right ankle toward your other shoulder until you feel a stretch in your buttocks. ? Keep your hips and shoulders firmly planted while you do this stretch. 5. Hold this position for __________ seconds. Repeat __________ times. Complete this exercise __________ times a day. Seated stretch This exercise is sometimes called hamstrings and adductors stretch. 1. Sit on the floor with your legs stretched wide. Keep your knees straight during this exercise. 2. Keeping your head and back in a straight line, bend at your waist to reach for your left foot (position A). You should feel a stretch in your right inner thigh (adductors). 3. Hold this position for __________ seconds. Then slowly return to the upright position. 4. Keeping your head and back in a straight line, bend at your waist to reach forward (position B). You should feel a stretch behind both of your thighs and knees (hamstrings). 5. Hold this position for __________ seconds. Then slowly return to the upright position. 6. Keeping your head and back in a straight line, bend at your waist to reach for your right foot (position C). You should feel a stretch in your left inner thigh (adductors). 7. Hold this position for __________ seconds. Then slowly return to the upright  position. Repeat __________ times. Complete this exercise __________ times a day. Lunge This exercise stretches the muscles of the hip (hip flexors). 1. Place your left / right knee on the floor and bend your other knee so that is directly over your ankle. You should be half-kneeling. 2. Keep good posture with your head over your shoulders. 3. Tighten your buttocks to point your tailbone downward. This will prevent your back from arching too much. 4. You should feel a gentle stretch in the front of your left / right thigh and hip. If you do not feel a stretch, slide your other foot forward slightly  and then slowly lunge forward with your chest up until your knee once again lines up over your ankle. ? Make sure your tailbone continues to point downward. 5. Hold this position for __________ seconds. 6. Slowly return to the starting position. Repeat __________ times. Complete this exercise __________ times a day. Strengthening exercises These exercises build strength and endurance in your hip. Endurance is the ability to use your muscles for a long time, even after they get tired. Bridge This exercise strengthens the muscles of your hip (hip extensors). 1. Lie on your back on a firm surface with your knees bent and your feet flat on the floor. 2. Tighten your buttocks muscles and lift your bottom off the floor until the trunk of your body and your hips are level with your thighs. ? Do not arch your back. ? You should feel the muscles working in your buttocks and the back of your thighs. If you do not feel these muscles, slide your feet 1-2 inches (2.5-5 cm) farther away from your buttocks. 3. Hold this position for __________ seconds. 4. Slowly lower your hips to the starting position. 5. Let your muscles relax completely between repetitions. Repeat __________ times. Complete this exercise __________ times a day. Straight leg raises, side-lying This exercise strengthens the muscles that move  the hip joint away from the center of the body (hip abductors). 1. Lie on your side with your left / right leg in the top position. Lie so your head, shoulder, hip, and knee line up. You may bend your bottom knee slightly to help you balance. 2. Roll your hips slightly forward, so your hips are stacked directly over each other and your left / right knee is facing forward. 3. Leading with your heel, lift your top leg 4-6 inches (10-15 cm). You should feel the muscles in your top hip lifting. ? Do not let your foot drift forward. ? Do not let your knee roll toward the ceiling. 4. Hold this position for __________ seconds. 5. Slowly return to the starting position. 6. Let your muscles relax completely between repetitions. Repeat __________ times. Complete this exercise __________ times a day. Straight leg raises, side-lying This exercise strengthens the muscles that move the hip joint toward the center of the body (hip adductors). 1. Lie on your side with your left / right leg in the bottom position. Lie so your head, shoulder, hip, and knee line up. You may place your upper foot in front to help you balance. 2. Roll your hips slightly forward, so your hips are stacked directly over each other and your left / right knee is facing forward. 3. Tense the muscles in your inner thigh and lift your bottom leg 4-6 inches (10-15 cm). 4. Hold this position for __________ seconds. 5. Slowly return to the starting position. 6. Let your muscles relax completely between repetitions. Repeat __________ times. Complete this exercise __________ times a day. Straight leg raises, supine This exercise strengthens the muscles in the front of your thigh (quadriceps). 1. Lie on your back (supine position) with your left / right leg extended and your other knee bent. 2. Tense the muscles in the front of your left / right thigh. You should see your kneecap slide up or see increased dimpling just above your knee. 3. Keep  these muscles tight as you raise your leg 4-6 inches (10-15 cm) off the floor. Do not let your knee bend. 4. Hold this position for __________ seconds. 5. Keep these muscles tense  as you lower your leg. 6. Relax the muscles slowly and completely between repetitions. Repeat __________ times. Complete this exercise __________ times a day. Hip abductors, standing This exercise strengthens the muscles that move the leg and hip joint away from the center of the body (hip abductors). 1. Tie one end of a rubber exercise band or tubing to a secure surface, such as a chair, table, or pole. 2. Loop the other end of the band or tubing around your left / right ankle. 3. Keeping your ankle with the band or tubing directly opposite the secured end, step away until there is tension in the tubing or band. Hold on to a chair, table, or pole as needed for balance. 4. Lift your left / right leg out to your side. While you do this: ? Keep your back upright. ? Keep your shoulders over your hips. ? Keep your toes pointing forward. ? Make sure to use your hip muscles to slowly lift your leg. Do not tip your body or forcefully lift your leg. 5. Hold this position for __________ seconds. 6. Slowly return to the starting position. Repeat __________ times. Complete this exercise __________ times a day. Squats This exercise strengthens the muscles in the front of your thigh (quadriceps). 1. Stand in a door frame so your feet and knees are in line with the frame. You may place your hands on the frame for balance. 2. Slowly bend your knees and lower your hips like you are going to sit in a chair. ? Keep your lower legs in a straight-up-and-down position. ? Do not let your hips go lower than your knees. ? Do not bend your knees lower than told by your health care provider. ? If your hip pain increases, do not bend as low. 3. Hold this position for ___________ seconds. 4. Slowly push with your legs to return to standing.  Do not use your hands to pull yourself to standing. Repeat __________ times. Complete this exercise __________ times a day. This information is not intended to replace advice given to you by your health care provider. Make sure you discuss any questions you have with your health care provider. Document Revised: 12/11/2018 Document Reviewed: 03/18/2018 Elsevier Patient Education  [place].

## 2019-10-27 ENCOUNTER — Ambulatory Visit: Payer: BLUE CROSS/BLUE SHIELD | Admitting: Podiatry

## 2019-11-03 ENCOUNTER — Telehealth: Payer: Self-pay

## 2019-11-03 NOTE — Telephone Encounter (Signed)
Client has upcoming dental appointment  With [facility] 11-13-2019 left voicemail and placed a reminder in clients mail box

## 2019-11-11 NOTE — Congregational Nurse Program (Signed)
  Dept: [phone]   Congregational Nurse Program Note  Date of Encounter: 11/11/2019  Past Medical History: Past Medical History:  Diagnosis Date  . Left hydrocele   . Type 2 diabetes, diet controlled (HCC)     Encounter Details:  CNP Questionnaire - 11/11/19 1630      Questionnaire   Patient Status Not Applicable    Race Black or African American    Location Patient Served At Not Applicable    Insurance Private Insurance    Uninsured Not Applicable    Food No food insecurities    Housing/Utilities No permanent housing    Transportation No transportation needs    Interpersonal Safety Yes, feel physically and emotionally safe where you currently live    Medication No medication insecurities    Medical Provider Yes    Referrals Primary Care Provider/Clinic;Dental    ED Visit Averted Not Applicable    Life-Saving Intervention Made Not Applicable         Brief visit to remind client of dental appointment for 11/13/2019 @8  :00 am,reviewed address .client to complete new patient infomation

## 2019-11-17 NOTE — Congregational Nurse Program (Signed)
  Dept: [phone]   Congregational Nurse Program Note  Date of Encounter: 11/17/2019  Past Medical History: Past Medical History:  Diagnosis Date  . Left hydrocele   . Type 2 diabetes, diet controlled (HCC)     Encounter Details:  CNP Questionnaire - 11/17/19 1335      Questionnaire   Patient Status Not Applicable    Race Black or African American    Location Patient Served At Not Applicable    [employer] [facility]    Uninsured Not Applicable    Food No food insecurities    Housing/Utilities No permanent housing    Transportation No transportation needs    Interpersonal Safety Yes, feel physically and emotionally safe where you currently live    Medication No medication insecurities    Medical Provider Yes    Referrals Dental    ED Visit Averted Not Applicable    Life-Saving Intervention Made Not Applicable          kept his initial dental appointment at [facility] was very satisfied but to get everything he needs the bill will be $20,000 dollars he states . He received a dental exam and x-ray needs some extractions he states . Nurse explained there are no dental funds available but he will need to just get his cleaning and maybe the extractions for now. He will add dental to his insurance and see what  That will cover and see how they [facility] can work with him   Follow as needed ,next appointment is 11-24-2019 at 8 am .

## 2019-12-07 ENCOUNTER — Ambulatory Visit: Payer: BLUE CROSS/BLUE SHIELD | Admitting: Podiatry

## 2019-12-17 ENCOUNTER — Ambulatory Visit: Payer: BLUE CROSS/BLUE SHIELD | Admitting: Podiatry

## 2019-12-23 ENCOUNTER — Ambulatory Visit: Payer: BLUE CROSS/BLUE SHIELD | Admitting: Podiatry

## 2020-01-06 ENCOUNTER — Ambulatory Visit: Payer: BLUE CROSS/BLUE SHIELD | Admitting: Podiatry

## 2020-01-06 ENCOUNTER — Other Ambulatory Visit: Payer: Self-pay

## 2020-01-06 DIAGNOSIS — M79674 Pain in right toe(s): Secondary | ICD-10-CM | POA: Diagnosis not present

## 2020-01-06 DIAGNOSIS — B351 Tinea unguium: Secondary | ICD-10-CM

## 2020-01-06 DIAGNOSIS — E1142 Type 2 diabetes mellitus with diabetic polyneuropathy: Secondary | ICD-10-CM

## 2020-01-06 DIAGNOSIS — M79675 Pain in left toe(s): Secondary | ICD-10-CM

## 2020-01-06 DIAGNOSIS — Q828 Other specified congenital malformations of skin: Secondary | ICD-10-CM | POA: Diagnosis not present

## 2020-01-07 NOTE — Progress Notes (Signed)
  Subjective:  Patient ID: Joseph Navarro, male    DOB: 1955-10-03,  MRN: [id number]  Chief Complaint  Patient presents with  . deride    DFC-BL toenails trimming (1-10) thick, discolored and hard to trimm  . Foot Problem    lesions at rt 5h toe and 3rd toe (tip) x 1 mo; rubbs - sore more at night time - no redness or swelling Tx: fOTC corn removal -pt states," better than what it was."   . Diabetes    FBS: "dont remember" A1c; "good, don't remeber the #" PCP: Jones x couple wks   64 y.o. male returns for the above complaint. Patient presents with thickened elongated dystrophic toenails x10. Patient states that they are painful to touch. She would he would like to have them debrided down. He does not recall his A1c. He is a diabetic. He also has callus formation to bilateral third digit distal tip secondary to hammertoe contractures. He would like to have these callus/hyperkeratotic lesions debrided down. He denies any other acute complaints.  Objective:  There were no vitals filed for this visit. Podiatric Exam: Vascular: dorsalis pedis and posterior tibial pulses are palpable bilateral. Capillary return is immediate. Temperature gradient is WNL. Skin turgor WNL  Sensorium: Normal Semmes Weinstein monofilament test. Normal tactile sensation bilaterally. Nail Exam: Pt has thick disfigured discolored nails with subungual debris noted bilateral entire nail hallux through fifth toenails.  Pain on palpation to the nails. Ulcer Exam: There is no evidence of ulcer or pre-ulcerative changes or infection. Orthopedic Exam: Muscle tone and strength are WNL. No limitations in general ROM. No crepitus or effusions noted. HAV  B/L.  Hammer toes 2-5  B/L. Skin: Porokeratosis noted to bilateral distal tip third digit. Mild pain on palpation. No infection or ulcers    Assessment & Plan:   1. Pain due to onychomycosis of toenails of both feet   2. Porokeratosis   3. Controlled type 2 diabetes mellitus  with diabetic polyneuropathy, without long-term current use of insulin (HCC)     Patient was evaluated and treated and all questions answered.  Bilateral distal tip third porokeratosis -I explained patient the etiology of porokeratosis and various treatment options were extensively discussed. I believe patient will benefit from aggressive debridement given that patient is a diabetic with unknown A1c to make sure that there is no underlying ulceration. Eventually patient may benefit from hammertoe repair to take the pressure off of the distal tip of the toe. However for now debridement of the thick skin has been beneficial.  Onychomycosis with pain  -Nails palliatively debrided as below. -Educated on self-care  Procedure: Nail Debridement Rationale: pain  Type of Debridement: manual, sharp debridement. Instrumentation: Nail nipper, rotary burr. Number of Nails: 10  Procedures and Treatment: Consent by patient was obtained for treatment procedures. The patient understood the discussion of treatment and procedures well. All questions were answered thoroughly reviewed. Debridement of mycotic and hypertrophic toenails, 1 through 5 bilateral and clearing of subungual debris. No ulceration, no infection noted.  Return Visit-Office Procedure: Patient instructed to return to the office for a follow up visit 3 months for continued evaluation and treatment.  Nicholes Rough, DPM    No follow-ups on file.

## 2020-12-20 ENCOUNTER — Emergency Department (HOSPITAL_COMMUNITY)
Admission: EM | Admit: 2020-12-20 | Discharge: 2020-12-21 | Disposition: A | Discharge status: Home / Self Care | Payer: Medicare Other | Attending: Emergency Medicine | Admitting: Emergency Medicine

## 2020-12-20 ENCOUNTER — Emergency Department (HOSPITAL_COMMUNITY): Payer: Medicare Other

## 2020-12-20 ENCOUNTER — Other Ambulatory Visit: Payer: Self-pay

## 2020-12-20 ENCOUNTER — Encounter (HOSPITAL_COMMUNITY): Payer: Self-pay

## 2020-12-20 DIAGNOSIS — R059 Cough, unspecified: Secondary | ICD-10-CM | POA: Diagnosis not present

## 2020-12-20 DIAGNOSIS — E119 Type 2 diabetes mellitus without complications: Secondary | ICD-10-CM | POA: Insufficient documentation

## 2020-12-20 DIAGNOSIS — Z7984 Long term (current) use of oral hypoglycemic drugs: Secondary | ICD-10-CM | POA: Diagnosis not present

## 2020-12-20 DIAGNOSIS — I10 Essential (primary) hypertension: Secondary | ICD-10-CM | POA: Insufficient documentation

## 2020-12-20 DIAGNOSIS — Z79899 Other long term (current) drug therapy: Secondary | ICD-10-CM | POA: Insufficient documentation

## 2020-12-20 DIAGNOSIS — Z7982 Long term (current) use of aspirin: Secondary | ICD-10-CM | POA: Insufficient documentation

## 2020-12-20 DIAGNOSIS — R079 Chest pain, unspecified: Secondary | ICD-10-CM | POA: Diagnosis present

## 2020-12-20 DIAGNOSIS — Z20822 Contact with and (suspected) exposure to covid-19: Secondary | ICD-10-CM | POA: Diagnosis not present

## 2020-12-20 LAB — COMPREHENSIVE METABOLIC PANEL
ALT: 35 U/L (ref 0–44)
AST: 32 U/L (ref 15–41)
Albumin: 4.2 g/dL (ref 3.5–5.0)
Alkaline Phosphatase: 45 U/L (ref 38–126)
Anion gap: 6 (ref 5–15)
BUN: 16 mg/dL (ref 8–23)
CO2: 29 mmol/L (ref 22–32)
Calcium: 9.6 mg/dL (ref 8.9–10.3)
Chloride: 104 mmol/L (ref 98–111)
Creatinine, Ser: 0.96 mg/dL (ref 0.61–1.24)
GFR, Estimated: >60 mL/min (ref >60)
Glucose, Bld: 126 mg/dL — ABNORMAL HIGH (ref 70–99)
Potassium: 4.3 mmol/L (ref 3.5–5.1)
Sodium: 139 mmol/L (ref 135–145)
Total Bilirubin: 0.5 mg/dL (ref 0.3–1.2)
Total Protein: 7.5 g/dL (ref 6.5–8.1)

## 2020-12-20 LAB — CBC WITH DIFFERENTIAL/PLATELET
Abs Immature Granulocytes: 0.01 10*3/uL (ref 0.00–0.07)
Basophils Absolute: 0 10*3/uL (ref 0.0–0.1)
Basophils Relative: 0 %
Eosinophils Absolute: 0.1 10*3/uL (ref 0.0–0.5)
Eosinophils Relative: 2 %
HCT: 37.9 % — ABNORMAL LOW (ref 39.0–52.0)
Hemoglobin: 12.2 g/dL — ABNORMAL LOW (ref 13.0–17.0)
Immature Granulocytes: 0 %
Lymphocytes Relative: 48 %
Lymphs Abs: 2.9 10*3/uL (ref 0.7–4.0)
MCH: 30 pg (ref 26.0–34.0)
MCHC: 32.2 g/dL (ref 30.0–36.0)
MCV: 93.3 fL (ref 80.0–100.0)
Monocytes Absolute: 0.5 10*3/uL (ref 0.1–1.0)
Monocytes Relative: 9 %
Neutro Abs: 2.5 10*3/uL (ref 1.7–7.7)
Neutrophils Relative %: 41 %
Platelets: 321 10*3/uL (ref 150–400)
RBC: 4.06 MIL/uL — ABNORMAL LOW (ref 4.22–5.81)
RDW: 13.1 % (ref 11.5–15.5)
WBC: 6.1 10*3/uL (ref 4.0–10.5)
nRBC: 0 % (ref 0.0–0.2)

## 2020-12-20 LAB — TROPONIN I (HIGH SENSITIVITY): Troponin I (High Sensitivity): 3 ng/L (ref <18)

## 2020-12-20 MED ORDER — LIDOCAINE 5 % EX PTCH
1.0000 | MEDICATED_PATCH | CUTANEOUS | Status: DC
  Administered 2020-12-20: 1 via TRANSDERMAL
  Filled 2020-12-20: qty 1

## 2020-12-20 NOTE — ED Provider Notes (Signed)
Emergency Medicine Provider Triage Evaluation Note  Maria Gallicchio , a 65 y.o. male  was evaluated in triage.  Pt complains of right-sided aching chest pain.  Patient states that his symptoms are mild but constant along the right anterior portion of his chest.  They worsen when coughing.  Denies any hemoptysis, shortness of breath, nausea, vomiting, diaphoresis.  He is not a smoker.  No other complaints.  Physical Exam  There were no vitals taken for this visit. Gen:   Awake, no distress   Resp:  Normal effort  MSK:   Moves extremities without difficulty  Other:    Medical Decision Making  Medically screening exam initiated at 5:49 PM.  Appropriate orders placed.  Caillou Minus was informed that the remainder of the evaluation will be completed by another provider, this initial triage assessment does not replace that evaluation, and the importance of remaining in the ED until their evaluation is complete.   Placido Sou, PA-C 12/20/20 1751    Derwood Kaplan, MD 12/21/20 [phone]

## 2020-12-20 NOTE — ED Triage Notes (Signed)
Patient c/o right chest pain x 2 days. Patient rates 7/10. Right chest pain worse with a cough. Cough is non productive. Patient denies any N/v/D

## 2020-12-20 NOTE — ED Provider Notes (Signed)
[place] DEPT Provider Note   CSN: [id number] Arrival date & time: 12/20/20  1728    History Chief Complaint  Patient presents with   Cough   Chest Pain    Joseph Navarro is a 65 y.o. male with a hx of T2DM and hypertension who presents to the ED with complaints of chest pain x 3 days. Pain is located to the right chest, aching in nature, occurs with coughing & certain movements, otherwise feels mildly sore. Has been having a dry cough which began prior to onset of pain. Denies fever, chills, N/V, dyspnea, URI sxs, leg pain/swelling, hemoptysis, recent surgery/trauma, recent long travel, hormone use, personal hx of cancer, or hx of DVT/PE.    HPI     Past Medical History:  Diagnosis Date   Left hydrocele    Type 2 diabetes, diet controlled [facility])     Patient Active Problem List   Diagnosis Date Noted   Essential hypertension 10/01/2019   Tinea pedis of both feet 10/01/2019   Hammertoe, bilateral 10/01/2019   Tendinitis of left rotator cuff 07/24/2019   Arthritis of right hip 01/29/2019   Arthritis of left hip 09/12/2018   Chronic pain of both hips 08/06/2018   Diabetes mellitus type II, controlled ([place]) 08/06/2018    Past Surgical History:  Procedure Laterality Date   HYDROCELE EXCISION Left 08/27/2014   Procedure: LEFT HYDROCELECTOMY ADULT;  Surgeon: Kathie Rhodes, MD;  Location: [facility];  Service: Urology;  Laterality: Left;   ORIF RIGHT ANKLE FX  10-22-2004   REMOVAL SYNDESMOTIC SCREW RIGHT ANKLE  12-26-2004       Family History  Family history unknown: Yes    Social History   Tobacco Use   Smoking status: Never   Smokeless tobacco: Never  Vaping Use   Vaping Use: Never used  Substance Use Topics   Alcohol use: No   Drug use: No    Home Medications Prior to Admission medications   Medication Sig Start Date End Date Taking? Authorizing Provider  aspirin 325 MG EC tablet TAKE 1 TABLET BY MOUTH TWICE A  DAY 04/02/19   [provider]  Blood Glucose Monitoring Suppl (FIFTY50 GLUCOSE METER 2.0) w/Device KIT Use as instructed 06/16/18   [provider]  capsaicin (ZOSTRIX) 0.025 % cream Using gloves apply to feet daily, avoid in between toes. 08/13/19   [provider]  clotrimazole-betamethasone (LOTRISONE) cream APPLY TOPICALLY 2 TIMES DAILY FOR 30 DAYS. 07/02/19   [provider]  ferrous sulfate 325 (65 FE) MG EC tablet Take by mouth. 07/28/19   [provider]  gabapentin (NEURONTIN) 300 MG capsule Take by mouth. 09/17/19   [provider]  HYDROcodone-acetaminophen (NORCO) 5-325 MG tablet Take 1 tablet by mouth every 6 (six) hours as needed (for pain). Patient not taking: Reported on 10/01/2019 01/10/18   Molpus, John, MD  losartan (COZAAR) 50 MG tablet Take by mouth. 07/24/19   [provider]  metFORMIN (GLUCOPHAGE) 500 MG tablet Take by mouth. 07/28/19   [provider]    Allergies    Patient has no known allergies.  Review of Systems   Review of Systems  Constitutional:  Negative for chills, diaphoresis and fever.  HENT:  Negative for congestion and ear pain.   Respiratory:  Positive for cough. Negative for shortness of breath.   Cardiovascular:  Positive for chest pain. Negative for leg swelling.  Gastrointestinal:  Negative for abdominal pain, nausea and vomiting.  Neurological:  Negative for syncope.  All other systems reviewed and are negative.  Physical Exam Updated Vital Signs BP 136/88   Pulse (!) 57   Temp 98.2 F (36.8 C) (Oral)   Resp 16   Ht '5\' 9"'  (1.753 m)   Wt 83.9 kg   SpO2 100%   BMI 27.32 kg/m   Physical Exam Vitals and nursing note reviewed.  Constitutional:      General: He is not in acute distress.    Appearance: He is well-developed. He is not toxic-appearing.  HENT:     Head: Normocephalic and atraumatic.  Eyes:     General:        Right eye: No discharge.        Left eye: No  discharge.     Conjunctiva/sclera: Conjunctivae normal.  Cardiovascular:     Rate and Rhythm: Normal rate and regular rhythm.     Pulses:          Radial pulses are 2+ on the right side and 2+ on the left side.  Pulmonary:     Effort: Pulmonary effort is normal. No respiratory distress.     Breath sounds: Normal breath sounds. No wheezing, rhonchi or rales.  Chest:     Chest wall: Tenderness (right anterior chest wall without overlying skin changes or rashes) present.  Abdominal:     General: There is no distension.     Palpations: Abdomen is soft.     Tenderness: There is no abdominal tenderness.  Musculoskeletal:     Cervical back: Neck supple.     Right lower leg: No tenderness. No edema.     Left lower leg: No tenderness. No edema.  Skin:    General: Skin is warm and dry.     Findings: No rash.  Neurological:     Mental Status: He is alert.     Comments: Clear speech.   Psychiatric:        Behavior: Behavior normal.    ED Results / Procedures / Treatments   Labs (all labs ordered are listed, but only abnormal results are displayed) Labs Reviewed  COMPREHENSIVE METABOLIC PANEL - Abnormal; Notable for the following components:      Result Value   Glucose, Bld 126 (*)    All other components within normal limits  CBC WITH DIFFERENTIAL/PLATELET - Abnormal; Notable for the following components:   RBC 4.06 (*)    Hemoglobin 12.2 (*)    HCT 37.9 (*)    All other components within normal limits  SARS CORONAVIRUS 2 (TAT 6-24 HRS)  TROPONIN I (HIGH SENSITIVITY)  TROPONIN I (HIGH SENSITIVITY)    EKG EKG Interpretation  Date/Time:  Tuesday December 20 2020 18:00:17 EDT Ventricular Rate:  66 PR Interval:  140 QRS Duration: 88 QT Interval:  381 QTC Calculation: 400 R Axis:   70 Text Interpretation: Sinus rhythm Abnormal R-wave progression, early transition Minimal ST elevation, anterior leads 12 Lead; Mason-Likar Confirmed by Thayer Jew [phone]) on 12/21/2020 1:23:16  AM  Radiology DG Chest Portable 1 View  Result Date: 12/20/2020 CLINICAL DATA:  Chest pain for 2 days. EXAM: PORTABLE CHEST 1 VIEW COMPARISON:  10/18/2004 and 06/24/2006 FINDINGS: Numerous leads and wires project over the chest. Midline trachea. Normal heart size and mediastinal contours. No pleural effusion or pneumothorax. Clear lungs. IMPRESSION: No acute cardiopulmonary disease. Electronically Signed   By: Abigail Miyamoto M.D.   On: 12/20/2020 18:46    Procedures Procedures   Medications Ordered in ED  Medications  lidocaine (LIDODERM) 5 % 1 patch (1 patch Transdermal Patch Applied 12/20/20 2239)    ED Course  I have reviewed the triage vital signs and the nursing notes.  Pertinent labs & imaging results that were available during my care of the patient were reviewed by me and considered in my medical decision making (see chart for details).    MDM Rules/Calculators/A&P                           Patient presents to the emergency department with chest pain. Patient nontoxic appearing, in no apparent distress, vitals without significant abnormality- intermittent mild bradycardia & HTN. Fairly benign physical exam- right anterior chest wall tenderness to palpation which reproduces patient's pain.    Additional history obtained:  Additional history obtained from chart review & nursing note review.   EKG: No STEMI  Lab Tests:  I reviewed and interpreted labs, which included:  CBC: Mild anemia- last labs from years ago- PCP recheck.  CMP: Fairly unremarkable.  Troponin: WNL- falt COVID testing: Pending.   Imaging Studies ordered:  CXR ordered in triage- I independently reviewed, formal radiology impression shows:  No acute cardiopulmonary disease  ED Course:  Heart pathway score 3, low risk, delta troponin flat, low suspicion for ACS. Patient is low risk wells, low suspicion for pulmonary embolism. Pain is not a tearing sensation, symmetric pulses, no widening of mediastinum on  CXR, doubt dissection. Unclear definitive etiology to patient's pain however given pain with coughing and certain movements as well as reproducibility with chest wall palpation felt that MSK pain is likely cause. Will tx supportively with PCP follow up. I discussed results, treatment plan, need for follow-up, and return precautions with the patient. Provided opportunity for questions, patient confirmed understanding and is in agreement with plan.    Portions of this note were generated with [employer]. Dictation errors may occur despite best attempts at proofreading.  Final Clinical Impression(s) / ED Diagnoses Final diagnoses:  Chest pain, unspecified type  Cough    Rx / DC Orders ED Discharge Orders          Ordered    benzonatate (TESSALON) 100 MG capsule  Every 8 hours        12/21/20 0126    naproxen (NAPROSYN) 375 MG tablet  2 times daily PRN        12/21/20 0126    lidocaine (LIDODERM) 5 %  Daily PRN        12/21/20 0126             Amaryllis Dyke, PA-C 12/21/20 2423    Tegeler, Gwenyth Allegra, MD 12/21/20 [phone]

## 2020-12-21 LAB — TROPONIN I (HIGH SENSITIVITY): Troponin I (High Sensitivity): 3 ng/L (ref <18)

## 2020-12-21 LAB — SARS CORONAVIRUS 2 (TAT 6-24 HRS): SARS Coronavirus 2: NEGATIVE

## 2020-12-21 MED ORDER — LIDOCAINE 5 % EX PTCH: 1.0000 | MEDICATED_PATCH | Freq: Every day | CUTANEOUS | 0 refills | Status: DC | PRN

## 2020-12-21 MED ORDER — BENZONATATE 100 MG PO CAPS: 100.0000 mg | ORAL_CAPSULE | Freq: Three times a day (TID) | ORAL | 0 refills | Status: DC

## 2020-12-21 MED ORDER — NAPROXEN 375 MG PO TABS: 375.0000 mg | ORAL_TABLET | Freq: Two times a day (BID) | ORAL | 0 refills | Status: AC | PRN

## 2020-12-21 NOTE — Discharge Instructions (Addendum)
You were seen in the emergency department today for chest pain. Your work-up in the emergency department has been overall reassuring. Your labs have been fairly normal and or similar to previous blood work you have had done- you do have some mild anemia which is new- please have this rechecked by primary care.  Your EKG and the enzyme we use to check your heart did not show an acute heart attack at this time. Your chest x-ray was normal.   We are sending you home with the following medicines: - Naproxen is a nonsteroidal anti-inflammatory medication that will help with pain and swelling. Be sure to take this medication as prescribed with food, 1 pill every 12 hours,  It should be taken with food, as it can cause stomach upset, and more seriously, stomach bleeding. Do not take other nonsteroidal anti-inflammatory medications with this such as Advil, Motrin, Aleve, Mobic, Goodie Powder, or Motrin.    - Lidoderm patch- apply one patch to your area of most significant pain once per day.  Remove and discard patch within 12 hours of application  - Tessalon-take every 8 hours as needed for coughing  You make take Tylenol per over the counter dosing with these medications.   We have prescribed you new medication(s) today. Discuss the medications prescribed today with your pharmacist as they can have adverse effects and interactions with your other medicines including over the counter and prescribed medications. Seek medical evaluation if you start to experience new or abnormal symptoms after taking one of these medicines, seek care immediately if you start to experience difficulty breathing, feeling of your throat closing, facial swelling, or rash as these could be indications of a more serious allergic reaction   We tested you for COVID-19, we will call you if these results are positive.   We would like you to follow up closely with your primary care provider within 1-3 days. Return to the ER immediately  should you experience any new or worsening symptoms including but not limited to return of pain, worsened pain, vomiting, shortness of breath, dizziness, lightheadedness, passing out, or any other concerns that you may have.

## 2021-09-30 IMAGING — DX DG CHEST 1V PORT
1 series · 1 of 1 positions shown · non-contrast
Comparison: 10/18/2004 and 06/24/2006

CLINICAL DATA: Chest pain for 2 days.

EXAM:
PORTABLE CHEST 1 VIEW

[chest ap]
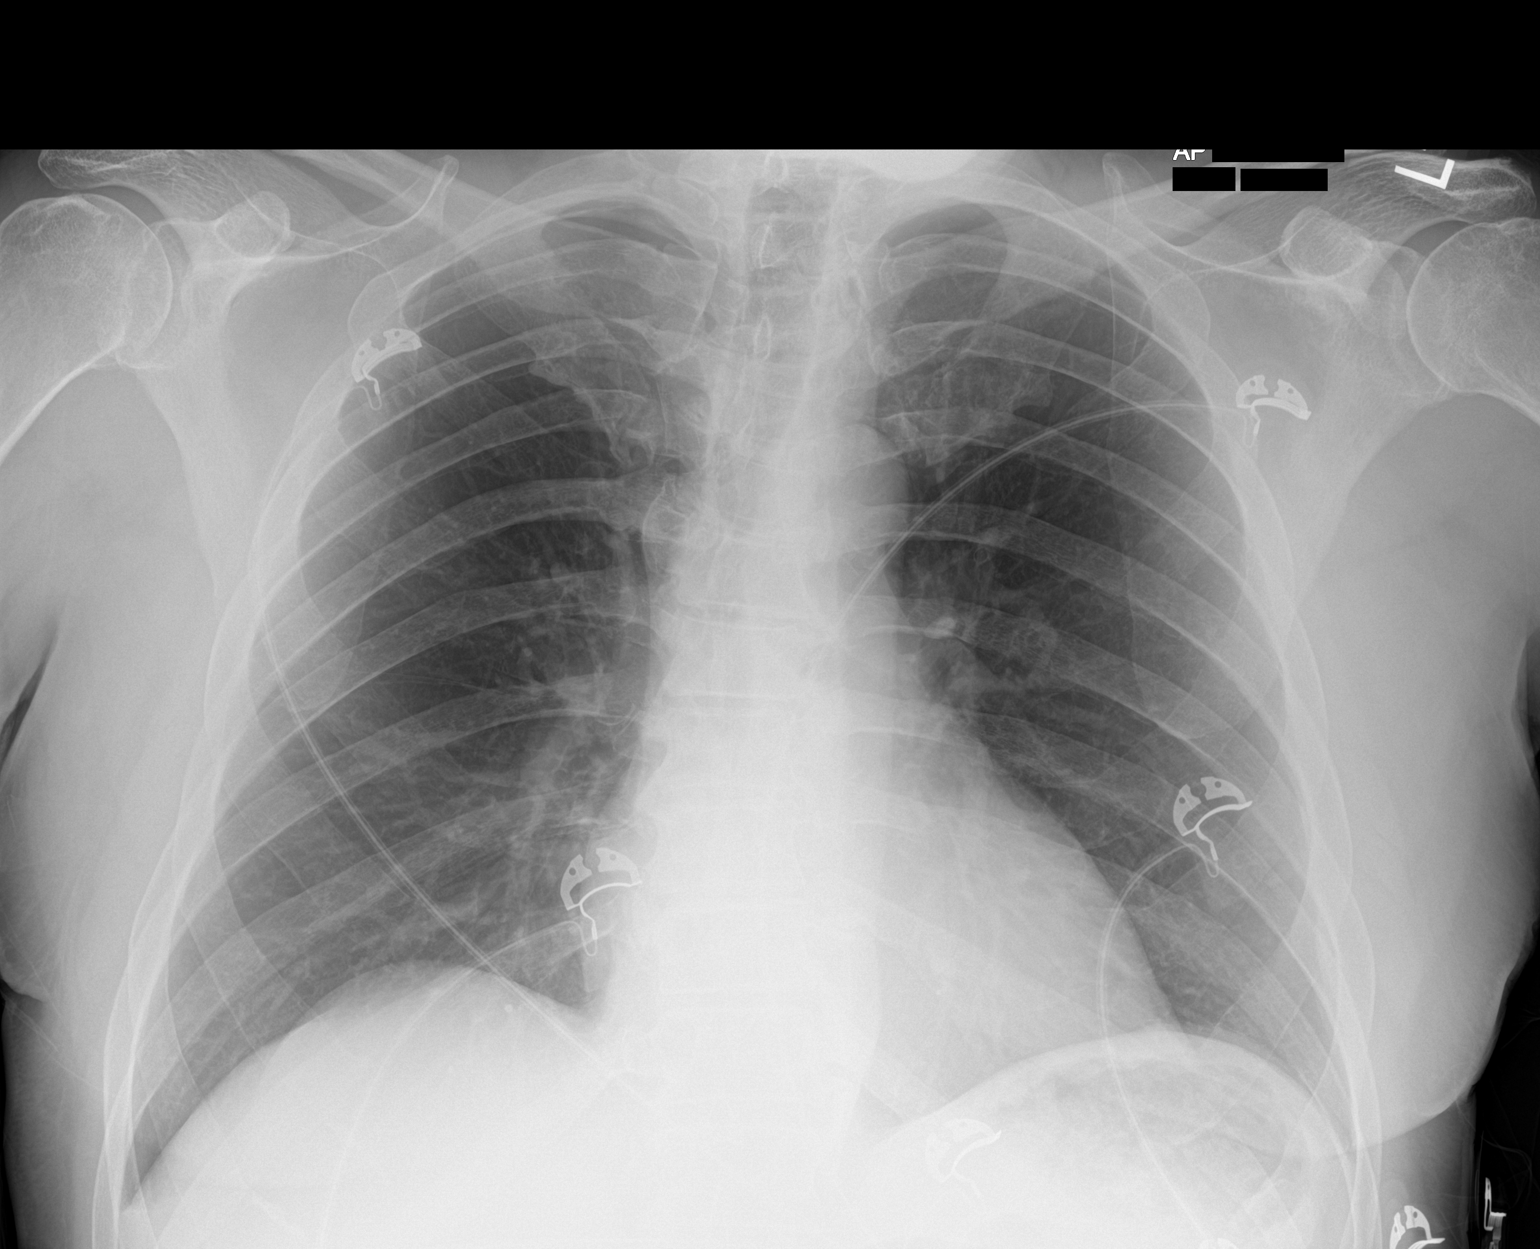

[1 of 1 positions shown; findings below may reference images not displayed]

FINDINGS: Numerous leads and wires project over the chest. Midline trachea.
Normal heart size and mediastinal contours. No pleural effusion or
pneumothorax. Clear lungs.
IMPRESSION: No acute cardiopulmonary disease.

## 2024-04-10 ENCOUNTER — Emergency Department (HOSPITAL_COMMUNITY)
Admission: EM | Admit: 2024-04-10 | Discharge: 2024-04-10 | Disposition: A | Discharge status: Home / Self Care | Source: Home / Self Care | Attending: Emergency Medicine | Admitting: Emergency Medicine

## 2024-04-10 DIAGNOSIS — I10 Essential (primary) hypertension: Secondary | ICD-10-CM | POA: Insufficient documentation

## 2024-04-10 DIAGNOSIS — Z7982 Long term (current) use of aspirin: Secondary | ICD-10-CM | POA: Insufficient documentation

## 2024-04-10 DIAGNOSIS — E119 Type 2 diabetes mellitus without complications: Secondary | ICD-10-CM | POA: Insufficient documentation

## 2024-04-10 DIAGNOSIS — M545 Low back pain, unspecified: Secondary | ICD-10-CM | POA: Insufficient documentation

## 2024-04-10 DIAGNOSIS — M7918 Myalgia, other site: Secondary | ICD-10-CM

## 2024-04-10 DIAGNOSIS — K611 Rectal abscess: Secondary | ICD-10-CM | POA: Insufficient documentation

## 2024-04-10 MED ORDER — AMOXICILLIN-POT CLAVULANATE 875-125 MG PO TABS: 1.0000 | ORAL_TABLET | Freq: Two times a day (BID) | ORAL | 0 refills | Status: DC

## 2024-04-10 MED ORDER — AMOXICILLIN-POT CLAVULANATE 875-125 MG PO TABS
1.0000 | ORAL_TABLET | Freq: Once | ORAL | Status: AC
  Administered 2024-04-10: 1 via ORAL
  Filled 2024-04-10: qty 1

## 2024-04-10 MED ORDER — KETOROLAC TROMETHAMINE 60 MG/2ML IM SOLN
30.0000 mg | Freq: Once | INTRAMUSCULAR | Status: AC
  Administered 2024-04-10: 30 mg via INTRAMUSCULAR
  Filled 2024-04-10: qty 2

## 2024-04-10 NOTE — ED Provider Notes (Signed)
 [place] EMERGENCY DEPARTMENT AT [facility] Provider Note  CSN: [id number] Arrival date & time: 04/10/24 9164  Chief Complaint(s) Rectal Pain  HPI Joseph Navarro is a 68 y.o. male who is here today with some pain on his right buttock that he says began this morning.  He denies any pain with defecation or blood in his stool.  Patient states that he notices when he sits he begins to have some pain in the right buttock.  No Fever, no chills.   Past Medical History Past Medical History:  Diagnosis Date   Left hydrocele    Type 2 diabetes, diet controlled [facility])    Patient Active Problem List   Diagnosis Date Noted   Essential hypertension 10/01/2019   Tinea pedis of both feet 10/01/2019   Hammertoe, bilateral 10/01/2019   Tendinitis of left rotator cuff 07/24/2019   Arthritis of right hip 01/29/2019   Arthritis of left hip 09/12/2018   Chronic pain of both hips 08/06/2018   Diabetes mellitus type II, controlled ([facility]) 08/06/2018   Home Medication(s) Prior to Admission medications   Medication Sig Start Date End Date Taking? Authorizing Provider  amoxicillin -clavulanate (AUGMENTIN ) 875-125 MG tablet Take 1 tablet by mouth every 12 (twelve) hours. 04/10/24  Yes Mannie Pac T, DO  aspirin 325 MG EC tablet TAKE 1 TABLET BY MOUTH TWICE A DAY 04/02/19   [provider]  benzonatate  (TESSALON ) 100 MG capsule Take 1 capsule (100 mg total) by mouth every 8 (eight) hours. 12/21/20   Petrucelli, Samantha R, PA-C  Blood Glucose Monitoring Suppl (FIFTY50 GLUCOSE METER 2.0) w/Device KIT Use as instructed 06/16/18   [provider]  capsaicin (ZOSTRIX) 0.025 % cream Using gloves apply to feet daily, avoid in between toes. 08/13/19   [provider]  clotrimazole-betamethasone (LOTRISONE) cream APPLY TOPICALLY 2 TIMES DAILY FOR 30 DAYS. 07/02/19   [provider]  ferrous sulfate 325 (65 FE) MG EC tablet Take by mouth. 07/28/19   [provider]  gabapentin  (NEURONTIN ) 300 MG capsule Take by mouth. 09/17/19   [provider]  HYDROcodone -acetaminophen  (NORCO) 5-325 MG tablet Take 1 tablet by mouth every 6 (six) hours as needed (for pain). Patient not taking: Reported on 10/01/2019 01/10/18   Molpus, John, MD  lidocaine  (LIDODERM ) 5 % Place 1 patch onto the skin daily as needed. Apply patch to area most significant pain once per day.  Remove and discard patch within 12 hours of application. 12/21/20   Petrucelli, Samantha R, PA-C  losartan  (COZAAR ) 50 MG tablet Take by mouth. 07/24/19   [provider]  metFORMIN (GLUCOPHAGE) 500 MG tablet Take by mouth. 07/28/19   [provider]  naproxen  (NAPROSYN ) 375 MG tablet Take 1 tablet (375 mg total) by mouth 2 (two) times daily as needed for moderate pain. 12/21/20   Petrucelli, Samantha R, PA-C  Past Surgical History Past Surgical History:  Procedure Laterality Date   HYDROCELE EXCISION Left 08/27/2014   Procedure: LEFT HYDROCELECTOMY ADULT;  Surgeon: Mark Ottelin, MD;  Location: [facility];  Service: Urology;  Laterality: Left;   ORIF RIGHT ANKLE FX  10-22-2004   REMOVAL SYNDESMOTIC SCREW RIGHT ANKLE  12-26-2004   Family History Family History  Family history unknown: Yes    Social History Social History   Tobacco Use   Smoking status: Never   Smokeless tobacco: Never  Vaping Use   Vaping status: Never Used  Substance Use Topics   Alcohol use: No   Drug use: No   Allergies Patient has no known allergies.  Review of Systems Review of Systems  Physical Exam Vital Signs  I have reviewed the triage vital signs BP (!) 154/88 (BP Location: Right Arm)   Pulse 66   Temp 97.9 F (36.6 C) (Oral)   Resp 18   SpO2 100%   Physical Exam Vitals and nursing note reviewed.  Constitutional:      Appearance: Normal  appearance.  Pulmonary:     Effort: Pulmonary effort is normal.  Abdominal:     General: Abdomen is flat.     Palpations: Abdomen is soft.  Genitourinary:    Comments: Nonthrombosed hemorrhoid at the 9 o'clock position.  No swelling, fluctuance noticed to the anus or anal verge. Musculoskeletal:     Comments: Patient's area of tenderness peers to be in the ischiorectal region.  I do not see any raised or fluctuant areas.  There is no erythema.  Neurological:     General: No focal deficit present.     Mental Status: He is alert.     ED Results and Treatments Labs (all labs ordered are listed, but only abnormal results are displayed) Labs Reviewed - No data to display                                                                                                                        Radiology No results found.  Pertinent labs & imaging results that were available during my care of the patient were reviewed by me and considered in my medical decision making (see MDM for details).  Medications Ordered in ED Medications  ketorolac  (TORADOL ) injection 30 mg (has no administration in time range)  amoxicillin -clavulanate (AUGMENTIN ) 875-125 MG per tablet 1 tablet (has no administration in time range)  Procedures Procedures  (including critical care time)  Medical Decision Making / ED Course   This patient presents to the ED for concern of buttock pain, this involves an extensive number of treatment options, and is a complaint that carries with it a high risk of complications and morbidity.  The differential diagnosis includes perirectal abscess, perianal abscess, musculoskeletal strain.  MDM: Patient is overall well-appearing.  He has normal vital signs.  His exam is overall reassuring.  I suspect that the patient is likely developing a small  ischiorectal abscess given the location of the area.  Discussed this concern with the patient, as well as different treatment modalities and diagnostic testing that we can perform.  Patient preferred to try symptomatic management with analgesia and antibiotics, rather than proceed with imaging and labs.  Will provide the patient with some Toradol  here in the ED.  Will send him a prescription for Augmentin .  I advised the patient that if he notices worsening pain or symptoms do not improve within 48 hours he needs to return to the emergency room.   Additional history obtained:  -External records from outside source obtained and reviewed including: Chart review including previous notes, labs, imaging, consultation notes   Lab Tests: -I ordered, reviewed, and interpreted labs.   The pertinent results include:   Labs Reviewed - No data to display    etation   Medicines ordered and prescription drug management: Meds ordered this encounter  Medications   ketorolac  (TORADOL ) injection 30 mg   amoxicillin -clavulanate (AUGMENTIN ) 875-125 MG per tablet 1 tablet   amoxicillin -clavulanate (AUGMENTIN ) 875-125 MG tablet    Sig: Take 1 tablet by mouth every 12 (twelve) hours.    Dispense:  14 tablet    Refill:  0    -I have reviewed the patients home medicines and have made adjustments as needed  Cardiac Monitoring: The patient was maintained on a cardiac monitor.  I personally viewed and interpreted the cardiac monitored which showed an underlying rhythm of: Normal sinus rhythm  Social Determinants of Health:  Factors impacting patients care include: Lack of access to primary care   Reevaluation: After the interventions noted above, I reevaluated the patient and found that they have :improved  Co morbidities that complicate the patient evaluation  Past Medical History:  Diagnosis Date   Left hydrocele    Type 2 diabetes, diet controlled ([facility])        Final Clinical Impression(s) /  ED Diagnoses Final diagnoses:  Perirectal abscess  Buttock pain     @PCDICTATION @    Mannie Pac T, DO 04/10/24 0908

## 2024-04-10 NOTE — ED Triage Notes (Signed)
 Rectal pain starting this morning. No bleeding.

## 2024-04-10 NOTE — Discharge Instructions (Signed)
 Based on your exam, I believe that you are developing an infection in the area where you are experiencing pain.  We are treating this with an antibiotic.  Please take Augmentin  once in the morning and once in the evening for the next 10 days.  You may take Motrin  and Tylenol  at home for pain.  Symptoms should begin to improve over the next 24 to 48 hours.  If you experience worsening pain, fever, redness in the area, or if your symptoms do not improve within 48 hours, it is very important that you return to the emergency department.  If symptoms improve with the prescribed treatment, please follow-up with your primary care doctor within 1 to 2 weeks.

## 2024-04-11 ENCOUNTER — Emergency Department (HOSPITAL_COMMUNITY)

## 2024-04-11 ENCOUNTER — Emergency Department (HOSPITAL_COMMUNITY)
Admission: EM | Admit: 2024-04-11 | Discharge: 2024-04-11 | Disposition: A | Discharge status: Home / Self Care | Source: Home / Self Care | Attending: Emergency Medicine | Admitting: Emergency Medicine

## 2024-04-11 ENCOUNTER — Other Ambulatory Visit: Payer: Self-pay

## 2024-04-11 ENCOUNTER — Inpatient Hospital Stay (HOSPITAL_COMMUNITY)
Admission: EM | Admit: 2024-04-11 | Discharge: 2024-04-15 | DRG: 349 | Disposition: A | Discharge status: Home / Self Care | Attending: Internal Medicine | Admitting: Internal Medicine

## 2024-04-11 DIAGNOSIS — I10 Essential (primary) hypertension: Secondary | ICD-10-CM | POA: Diagnosis present

## 2024-04-11 DIAGNOSIS — E119 Type 2 diabetes mellitus without complications: Secondary | ICD-10-CM | POA: Diagnosis present

## 2024-04-11 DIAGNOSIS — Z79899 Other long term (current) drug therapy: Secondary | ICD-10-CM | POA: Insufficient documentation

## 2024-04-11 DIAGNOSIS — Z7984 Long term (current) use of oral hypoglycemic drugs: Secondary | ICD-10-CM | POA: Insufficient documentation

## 2024-04-11 DIAGNOSIS — K644 Residual hemorrhoidal skin tags: Secondary | ICD-10-CM | POA: Insufficient documentation

## 2024-04-11 DIAGNOSIS — K610 Anal abscess: Secondary | ICD-10-CM | POA: Diagnosis not present

## 2024-04-11 DIAGNOSIS — Z7982 Long term (current) use of aspirin: Secondary | ICD-10-CM | POA: Insufficient documentation

## 2024-04-11 DIAGNOSIS — K6289 Other specified diseases of anus and rectum: Principal | ICD-10-CM

## 2024-04-11 LAB — COMPREHENSIVE METABOLIC PANEL WITH GFR
ALT: 24 U/L (ref 0–44)
AST: 21 U/L (ref 15–41)
Albumin: 4.3 g/dL (ref 3.5–5.0)
Alkaline Phosphatase: 66 U/L (ref 38–126)
Anion gap: 11 (ref 5–15)
BUN: 9 mg/dL (ref 8–23)
CO2: 25 mmol/L (ref 22–32)
Calcium: 9.7 mg/dL (ref 8.9–10.3)
Chloride: 100 mmol/L (ref 98–111)
Creatinine, Ser: 0.99 mg/dL (ref 0.61–1.24)
GFR, Estimated: >60 mL/min (ref >60)
Glucose, Bld: 166 mg/dL — ABNORMAL HIGH (ref 70–99)
Potassium: 3.7 mmol/L (ref 3.5–5.1)
Sodium: 136 mmol/L (ref 135–145)
Total Bilirubin: 0.5 mg/dL (ref 0.0–1.2)
Total Protein: 8 g/dL (ref 6.5–8.1)

## 2024-04-11 LAB — CBC
HCT: 40.9 % (ref 39.0–52.0)
Hemoglobin: 13.4 g/dL (ref 13.0–17.0)
MCH: 30.4 pg (ref 26.0–34.0)
MCHC: 32.8 g/dL (ref 30.0–36.0)
MCV: 92.7 fL (ref 80.0–100.0)
Platelets: 351 K/uL (ref 150–400)
RBC: 4.41 MIL/uL (ref 4.22–5.81)
RDW: 12.3 % (ref 11.5–15.5)
WBC: 10.1 K/uL (ref 4.0–10.5)
nRBC: 0 % (ref 0.0–0.2)

## 2024-04-11 LAB — CBG MONITORING, ED: Glucose-Capillary: 162 mg/dL — ABNORMAL HIGH (ref 70–99)

## 2024-04-11 LAB — GLUCOSE, CAPILLARY: Glucose-Capillary: 167 mg/dL — ABNORMAL HIGH (ref 70–99)

## 2024-04-11 MED ORDER — ACETAMINOPHEN 650 MG RE SUPP: 650.0000 mg | Freq: Four times a day (QID) | RECTAL | Status: DC | PRN

## 2024-04-11 MED ORDER — WITCH HAZEL-GLYCERIN EX PADS
MEDICATED_PAD | CUTANEOUS | Status: DC | PRN
  Filled 2024-04-11: qty 100

## 2024-04-11 MED ORDER — PIPERACILLIN-TAZOBACTAM 3.375 G IVPB 30 MIN
3.3750 g | Freq: Once | INTRAVENOUS | Status: AC
  Administered 2024-04-11: 3.375 g via INTRAVENOUS
  Filled 2024-04-11: qty 50

## 2024-04-11 MED ORDER — OXYCODONE-ACETAMINOPHEN 5-325 MG PO TABS
1.0000 | ORAL_TABLET | Freq: Once | ORAL | Status: AC
  Administered 2024-04-11: 1 via ORAL
  Filled 2024-04-11: qty 1

## 2024-04-11 MED ORDER — LABETALOL HCL 5 MG/ML IV SOLN: 10.0000 mg | INTRAVENOUS | Status: DC | PRN

## 2024-04-11 MED ORDER — IOHEXOL 300 MG/ML  SOLN
100.0000 mL | Freq: Once | INTRAMUSCULAR | Status: AC | PRN
  Administered 2024-04-11: 100 mL via INTRAVENOUS

## 2024-04-11 MED ORDER — HYDROMORPHONE HCL 1 MG/ML IJ SOLN
1.0000 mg | Freq: Once | INTRAMUSCULAR | Status: AC
  Administered 2024-04-11: 1 mg via INTRAVENOUS
  Filled 2024-04-11: qty 1

## 2024-04-11 MED ORDER — SODIUM CHLORIDE 0.9 % IV SOLN: INTRAVENOUS | Status: DC

## 2024-04-11 MED ORDER — ONDANSETRON HCL 4 MG PO TABS: 4.0000 mg | ORAL_TABLET | Freq: Four times a day (QID) | ORAL | Status: DC | PRN

## 2024-04-11 MED ORDER — GABAPENTIN 100 MG PO CAPS: 300.0000 mg | ORAL_CAPSULE | Freq: Every day | ORAL | Status: DC

## 2024-04-11 MED ORDER — INSULIN ASPART 100 UNIT/ML IJ SOLN
0.0000 [IU] | INTRAMUSCULAR | Status: DC
  Administered 2024-04-11 (×2): 2 [IU] via SUBCUTANEOUS
  Administered 2024-04-12: 9 [IU] via SUBCUTANEOUS
  Administered 2024-04-12: 1 [IU] via SUBCUTANEOUS
  Administered 2024-04-12: 2 [IU] via SUBCUTANEOUS
  Administered 2024-04-13: 3 [IU] via SUBCUTANEOUS
  Administered 2024-04-13: 2 [IU] via SUBCUTANEOUS
  Administered 2024-04-13: 1 [IU] via SUBCUTANEOUS
  Administered 2024-04-13 (×2): 3 [IU] via SUBCUTANEOUS
  Administered 2024-04-13: 2 [IU] via SUBCUTANEOUS
  Administered 2024-04-13 – 2024-04-14 (×2): 3 [IU] via SUBCUTANEOUS
  Administered 2024-04-14: 1 [IU] via SUBCUTANEOUS
  Administered 2024-04-14: 3 [IU] via SUBCUTANEOUS
  Administered 2024-04-14 – 2024-04-15 (×4): 1 [IU] via SUBCUTANEOUS
  Administered 2024-04-15: 2 [IU] via SUBCUTANEOUS
  Filled 2024-04-11: qty 9
  Filled 2024-04-11: qty 2
  Filled 2024-04-11: qty 3
  Filled 2024-04-11: qty 2
  Filled 2024-04-11: qty 3
  Filled 2024-04-11 (×2): qty 1
  Filled 2024-04-11 (×2): qty 3
  Filled 2024-04-11 (×4): qty 1
  Filled 2024-04-11: qty 2
  Filled 2024-04-11 (×2): qty 3
  Filled 2024-04-11: qty 2
  Filled 2024-04-11: qty 3
  Filled 2024-04-11: qty 1
  Filled 2024-04-11: qty 2

## 2024-04-11 MED ORDER — ONDANSETRON HCL 4 MG/2ML IJ SOLN
4.0000 mg | Freq: Four times a day (QID) | INTRAMUSCULAR | Status: DC | PRN
  Administered 2024-04-12: 4 mg via INTRAVENOUS

## 2024-04-11 MED ORDER — ONDANSETRON HCL 4 MG/2ML IJ SOLN
4.0000 mg | Freq: Once | INTRAMUSCULAR | Status: AC
  Administered 2024-04-11: 4 mg via INTRAVENOUS
  Filled 2024-04-11: qty 2

## 2024-04-11 MED ORDER — HYDROCODONE-ACETAMINOPHEN 5-325 MG PO TABS
1.0000 | ORAL_TABLET | ORAL | Status: DC | PRN
  Administered 2024-04-11 – 2024-04-15 (×7): 2 via ORAL
  Filled 2024-04-11 (×7): qty 2

## 2024-04-11 MED ORDER — FENTANYL CITRATE (PF) 50 MCG/ML IJ SOSY
12.5000 ug | PREFILLED_SYRINGE | INTRAMUSCULAR | Status: DC | PRN
  Administered 2024-04-12: 50 ug via INTRAVENOUS
  Filled 2024-04-11: qty 1

## 2024-04-11 MED ORDER — ACETAMINOPHEN 325 MG PO TABS: 650.0000 mg | ORAL_TABLET | Freq: Four times a day (QID) | ORAL | Status: DC | PRN

## 2024-04-11 MED ORDER — HYDROCORTISONE (PERIANAL) 2.5 % EX CREA: 1.0000 | TOPICAL_CREAM | Freq: Two times a day (BID) | CUTANEOUS | 0 refills | Status: DC

## 2024-04-11 MED ORDER — PIPERACILLIN-TAZOBACTAM 3.375 G IVPB
3.3750 g | Freq: Three times a day (TID) | INTRAVENOUS | Status: DC
  Administered 2024-04-12 – 2024-04-15 (×10): 3.375 g via INTRAVENOUS
  Filled 2024-04-11 (×10): qty 50

## 2024-04-11 NOTE — ED Provider Notes (Addendum)
 [facility] Provider Note   CSN: [id number] Arrival date & time: 04/11/24  1414     Patient presents with: No chief complaint on file.   Joseph Navarro is a 68 y.o. male.   Patient seen yesterday and earlier today for rectal pain.  Diagnosis been a nonthrombosed external hemorrhoid.  Patient states the pain is severe.  Patient has not been given any narcotic pain medicine.  Was started on antibiotics Augmentin .  Not clear whether he is taking any yet.  Patient did not have a CT.  Patient did have labs this morning around 3 in the morning CBC and complete metabolic panel without any significant abnormalities.  Past medical history significant for diabetes.  Patient is on metformin.  Patient does not use any tobacco products.       Prior to Admission medications   Medication Sig Start Date End Date Taking? Authorizing Provider  amoxicillin -clavulanate (AUGMENTIN ) 875-125 MG tablet Take 1 tablet by mouth every 12 (twelve) hours. 04/10/24   Mannie Fairy DASEN, DO  aspirin 325 MG EC tablet TAKE 1 TABLET BY MOUTH TWICE A DAY 04/02/19   [provider]  benzonatate  (TESSALON ) 100 MG capsule Take 1 capsule (100 mg total) by mouth every 8 (eight) hours. 12/21/20   Petrucelli, Samantha R, PA-C  Blood Glucose Monitoring Suppl (FIFTY50 GLUCOSE METER 2.0) w/Device KIT Use as instructed 06/16/18   [provider]  capsaicin (ZOSTRIX) 0.025 % cream Using gloves apply to feet daily, avoid in between toes. 08/13/19   [provider]  clotrimazole-betamethasone (LOTRISONE) cream APPLY TOPICALLY 2 TIMES DAILY FOR 30 DAYS. 07/02/19   [provider]  ferrous sulfate 325 (65 FE) MG EC tablet Take by mouth. 07/28/19   [provider]  gabapentin  (NEURONTIN ) 300 MG capsule Take by mouth. 09/17/19   [provider]  HYDROcodone -acetaminophen  (NORCO) 5-325 MG tablet Take 1 tablet by mouth every 6 (six) hours as  needed (for pain). Patient not taking: Reported on 10/01/2019 01/10/18   Molpus, Norleen, MD  hydrocortisone  (ANUSOL -HC) 2.5 % rectal cream Place 1 Application rectally 2 (two) times daily. 04/11/24   Sponseller, Rebekah R, PA-C  lidocaine  (LIDODERM ) 5 % Place 1 patch onto the skin daily as needed. Apply patch to area most significant pain once per day.  Remove and discard patch within 12 hours of application. 12/21/20   Petrucelli, Samantha R, PA-C  losartan  (COZAAR ) 50 MG tablet Take by mouth. 07/24/19   [provider]  metFORMIN (GLUCOPHAGE) 500 MG tablet Take by mouth. 07/28/19   [provider]  naproxen  (NAPROSYN ) 375 MG tablet Take 1 tablet (375 mg total) by mouth 2 (two) times daily as needed for moderate pain. 12/21/20   Petrucelli, Samantha R, PA-C    Allergies: Patient has no known allergies.    Review of Systems  Constitutional:  Negative for chills and fever.  HENT:  Negative for ear pain and sore throat.   Eyes:  Negative for pain and visual disturbance.  Respiratory:  Negative for cough and shortness of breath.   Cardiovascular:  Negative for chest pain and palpitations.  Gastrointestinal:  Positive for rectal pain. Negative for abdominal pain and vomiting.  Genitourinary:  Negative for dysuria and hematuria.  Musculoskeletal:  Negative for arthralgias and back pain.  Skin:  Negative for color change and rash.  Neurological:  Negative for seizures and syncope.  All other systems reviewed and are negative.   Updated Vital Signs  BP (!) 172/86   Pulse 91   Temp 99.2 F (37.3 C) (Oral)   Resp 18   SpO2 94%   Physical Exam Vitals and nursing note reviewed.  Constitutional:      General: He is not in acute distress.    Appearance: Normal appearance. He is well-developed. He is not ill-appearing.  HENT:     Head: Normocephalic and atraumatic.  Eyes:     Conjunctiva/sclera: Conjunctivae normal.  Cardiovascular:     Rate and Rhythm: Normal rate and regular  rhythm.     Heart sounds: No murmur heard. Pulmonary:     Effort: Pulmonary effort is normal. No respiratory distress.     Breath sounds: Normal breath sounds.  Abdominal:     Palpations: Abdomen is soft.     Tenderness: There is no abdominal tenderness.  Genitourinary:    Penis: Normal.      Testes: Normal.     Comments: Right side of the anal area with a large external hemorrhoid.  It is tender to palpation no obvious thrombosis.  No induration around the perianal area or buttock area. Musculoskeletal:        General: No swelling.     Cervical back: Neck supple.  Skin:    General: Skin is warm and dry.     Capillary Refill: Capillary refill takes less than 2 seconds.  Neurological:     General: No focal deficit present.     Mental Status: He is alert and oriented to person, place, and time.  Psychiatric:        Mood and Affect: Mood normal.     (all labs ordered are listed, but only abnormal results are displayed) Labs Reviewed - No data to display  EKG: None  Radiology: CT ABDOMEN PELVIS W CONTRAST Result Date: 04/11/2024 EXAM: CT ABDOMEN AND PELVIS WITH CONTRAST 04/11/2024 04:44:59 PM TECHNIQUE: CT of the abdomen and pelvis was performed with the administration of 100 mL of iohexol  (OMNIPAQUE ) 300 MG/ML solution. Multiplanar reformatted images are provided for review. Automated exposure control, iterative reconstruction, and/or weight-based adjustment of the mA/kV was utilized to reduce the radiation dose to as low as reasonably achievable. COMPARISON: None available. CLINICAL HISTORY: Abdominal pain, acute, nonlocalized; Rectal pain. FINDINGS: LOWER CHEST: Prominent atelectasis in the lung bases. Motion artifact limits examination. LIVER: The liver is unremarkable. GALLBLADDER AND BILE DUCTS: Gallbladder is unremarkable. No biliary ductal dilatation. SPLEEN: No acute abnormality. PANCREAS: No acute abnormality. ADRENAL GLANDS: No acute abnormality. KIDNEYS, URETERS AND  BLADDER: Bilateral renal cysts, largest measuring up to 3.5 cm diameter. No imaging follow-up is indicated. No stones in the kidneys or ureters. No hydronephrosis. No perinephric or periureteral stranding. 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 GI AND BOWEL: The stomach, small bowel, and colon are not abnormally distended. No wall thickening or inflammatory infiltration is demonstrated. Scattered colonic diverticula without evidence of acute diverticulitis. The appendix is normal. There is a posterior perianal collection with an air-fluid level measuring 3.4 cm diameter. This appears to be posterior to the anorectal junction and likely represents a perianal abscess. Posterior rectocele would be less likely. PERITONEUM AND RETROPERITONEUM: No ascites. No free air. VASCULATURE: Aorta is normal in caliber. Calcification of the aorta. LYMPH NODES: No lymphadenopathy. 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 BONES AND SOFT TISSUES: Degenerative changes in the spine. 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 No acute osseous abnormality. IMPRESSION: 1. Posterior perianal collection with an air-fluid level measuring 3.4 cm, most consistent with a perianal abscess. Posterior rectocele is less likely. 2. Scattered colonic diverticula without evidence of acute diverticulitis. Electronically signed by: Elsie Gravely MD 04/11/2024 04:52 PM EST RP Workstation: [id number]     Procedures   Medications Ordered in the ED  ondansetron  (ZOFRAN ) injection 4 mg (4 mg Intravenous Given 04/11/24 1614)  HYDROmorphone  (DILAUDID ) injection 1 mg (1 mg Intravenous Given 04/11/24 1614)  iohexol  (OMNIPAQUE ) 300 MG/ML solution 100 mL (100 mLs Intravenous Contrast Given 04/11/24 1635)                                     Medical Decision Making Amount and/or Complexity of Data Reviewed Radiology: ordered.  Risk Prescription drug management.   Since is the patient's third visit in 2 days for this complaint.  Will go ahead and do a CT scan abdomen pelvis just to rule out any deep space infection.  Clearly there is external hemorrhoid.  But it may not be the source of all the pain.  Will give some IV pain medicine as well.  CT scan does suggest that there is a posterior perianal collection air-fluid level measuring 3.4 cm most consistent with perianal abscess posterior rectocele less likely.  Patient certainly is tender but I cannot feel any induration or any area of fluctuance.  Will recheck him.  If I am not able to locate where this may be from will have discussion with on-call general surgery.  Did reexamine him.  As stated before there is some tenderness to the external hemorrhoid.  Which is more to the right side.  And it is a little more tender on that posteriorly.  But right posterior to the anal verge there is really no induration or noted fluctuance.  I have reviewed the image on the CT scan as well.  Not really able to easily locate exactly where this is.  Will discuss with general surgery.   Patient seen by Dr. Tanda from neurosurgery.  Recommending medicine admission planning to take care of the abscess in the operating room either tomorrow or the next day.   Final diagnoses:  Perianal pain  External hemorrhoid  Perianal abscess    ED Discharge Orders     None          Geraldene Hamilton, MD 04/11/24 1540    Geraldene Hamilton, MD 04/11/24 NELIDA    Geraldene Hamilton, MD 04/11/24 [place]    Geraldene Hamilton, MD 04/11/24 [phone]

## 2024-04-11 NOTE — ED Triage Notes (Signed)
 Patient c/o rectal pain and abscess started yesterday morning. Patient was seen yesterday with same complain and was discharge with antibiotic and Pain medication. Patient stated medication was not working and feels the bump is getting bigger. Patient denies fever.

## 2024-04-11 NOTE — Assessment & Plan Note (Signed)
Allow permissive Hypertension

## 2024-04-11 NOTE — Assessment & Plan Note (Signed)
 Appreciate general surgery consult although present this is not likely what is causing the pain Not thrombosed.  General surgery may address during procedure for now supportive management

## 2024-04-11 NOTE — Discharge Instructions (Signed)
 You are seen in the ER today for your rectal pain.  You have a hemorrhoid for which you have been prescribed topical steroid cream.  You may also pick up Tucks pads or spray over-the-counter which may assist with your symptoms. Recommend you initiate some stool softener with MiraLAX or Colace to help you avoid straining when you have bowel movements.  Follow-up with your primary care doctor return to the ER with any severe symptoms.

## 2024-04-11 NOTE — H&P (Signed)
 Joseph Navarro FMW:[id number] DOB: August 10, 1955 DOA: 04/11/2024     PCP: Tammy Tari DASEN, PA-C     Patient arrived to ER on 04/11/24 at 1414 Referred by Attending Zackowski, Scott, MD   Patient coming from:    home Lives alone,        Chief Complaint: perianal pain     HPI: [name] is a 68 y.o. male with medical history significant of DM2, hypertension  Presented with  rectal pain for the past 3 days Patient presents yesterday with rectal pain was diagnosed with nonthrombosed  external hemorrhoids He was seen in the emergency department for the same and prescribed antibiotics and pain medications he felt like he was getting worse and came back Reports discomfort with bowel movement Patient has been prescribed Augmentin  he is diabetic Patient initially was discharged to home again with recommended to use Tucks came back again saying antibiotics are not helping his pain. No leukocytosis earlier today CT scan done showing posterior perianal area fluid collection measuring 3.4 cm consistent with perianal abscess General Surgery was consulted plan for OR either tomorrow or next day    Reports pain in on the inside when he is trying to have a BM not on outside  No fever no chills, no N/V /D Denies significant ETOH intake   Does not smoke   Denies marijuana use      Regarding pertinent Chronic problems:      HTN on cozaar          DM 2 -  Lab Results  Component Value Date   HGBA1C 7.4 (A) 10/01/2019   on   PO meds only     While in ER:   CT scan showed evidence of perianal abscess Neurosurgery consulted patient started on Zosyn  pain medications provided   Lab Orders  No laboratory test(s) ordered today    CTabd/pelvis -  Posterior perianal collection with an air-fluid level measuring 3.4 cm, most consistent with a perianal abscess. Posterior rectocele is less likely. 2. Scattered colonic diverticula without evidence of acute  diverticulitis.  Following Medications were ordered in ER: Medications  ondansetron  (ZOFRAN ) injection 4 mg (4 mg Intravenous Given 04/11/24 1614)  HYDROmorphone  (DILAUDID ) injection 1 mg (1 mg Intravenous Given 04/11/24 1614)  iohexol  (OMNIPAQUE ) 300 MG/ML solution 100 mL (100 mLs Intravenous Contrast Given 04/11/24 1635)    _______________________________________________________ ER Provider Called:      General surgery Dr. Tanda  They Recommend admit to medicine    SEEN in ER     ED Triage Vitals  Encounter Vitals Group     BP 04/11/24 1516 (!) 190/83     Girls Systolic BP Percentile --      Girls Diastolic BP Percentile --      Boys Systolic BP Percentile --      Boys Diastolic BP Percentile --      Pulse Rate 04/11/24 1516 86     Resp 04/11/24 1516 18     Temp 04/11/24 1514 99.2 F (37.3 C)     Temp Source 04/11/24 1514 Oral     SpO2 04/11/24 1516 97 %     Weight --      Height --      Head Circumference --      Peak Flow --      Pain Score 04/11/24 1425 10     Pain Loc --      Pain Education --      Exclude  from Growth Chart --   TMAX(24)@        ECG: Ordered   The recent clinical data is shown below. Vitals:   04/11/24 1514 04/11/24 1516 04/11/24 1656 04/11/24 2003  BP:  (!) 190/83 (!) 172/86 (!) 177/94  Pulse:  86 91 85  Resp:  18 18 19   Temp: 99.2 F (37.3 C)   98.9 F (37.2 C)  TempSrc: Oral   Oral  SpO2:  97% 94% 96%     WBC     Component Value Date/Time   WBC 10.1 04/11/2024 0302   LYMPHSABS 2.9 12/20/2020 1922   MONOABS 0.5 12/20/2020 1922   EOSABS 0.1 12/20/2020 1922   BASOSABS 0.0 12/20/2020 1922         ABX started Zosyn   __________________________________________________________ Recent Labs  Lab 04/11/24 0302  NA 136  K 3.7  CO2 25  GLUCOSE 166*  BUN 9  CREATININE 0.99  CALCIUM 9.7    Cr stable,    Lab Results  Component Value Date   CREATININE 0.99 04/11/2024   CREATININE 0.96 12/20/2020   CREATININE 0.80  08/27/2014    Recent Labs  Lab 04/11/24 0302  AST 21  ALT 24  ALKPHOS 66  BILITOT 0.5  PROT 8.0  ALBUMIN 4.3   Lab Results  Component Value Date   CALCIUM 9.7 04/11/2024    Plt: Lab Results  Component Value Date   PLT 351 04/11/2024      Recent Labs  Lab 04/11/24 0302  WBC 10.1  HGB 13.4  HCT 40.9  MCV 92.7  PLT 351    HG/HCT  stable,     Component Value Date/Time   HGB 13.4 04/11/2024 0302   HCT 40.9 04/11/2024 0302   MCV 92.7 04/11/2024 0302   ______________________________________ Hospitalist was called for admission for   External hemorrhoid  Perianal abscess     The following Work up has been ordered so far:  Orders Placed This Encounter  Procedures   CT ABDOMEN PELVIS W CONTRAST   Consult to general surgery   Consult to hospitalist   Saline lock IV     OTHER Significant initial  Findings:  labs showing:     DM  labs:  HbA1C: No results for input(s): HGBA1C in the last 8760 hours.     CBG (last 3)  No results for input(s): GLUCAP in the last 72 hours.        Cultures: No results found for: SDES, SPECREQUEST, CULT, REPTSTATUS   Radiological Exams on Admission: CT ABDOMEN PELVIS W CONTRAST Result Date: 04/11/2024 EXAM: CT ABDOMEN AND PELVIS WITH CONTRAST 04/11/2024 04:44:59 PM TECHNIQUE: CT of the abdomen and pelvis was performed with the administration of 100 mL of iohexol  (OMNIPAQUE ) 300 MG/ML solution. Multiplanar reformatted images are provided for review. Automated exposure control, iterative reconstruction, and/or weight-based adjustment of the mA/kV was utilized to reduce the radiation dose to as low as reasonably achievable. COMPARISON: None available. CLINICAL HISTORY: Abdominal pain, acute, nonlocalized; Rectal pain. FINDINGS: LOWER CHEST: Prominent atelectasis in the lung bases. Motion artifact limits examination. LIVER: The liver is unremarkable. GALLBLADDER AND BILE DUCTS: Gallbladder is unremarkable. No biliary  ductal dilatation. SPLEEN: No acute abnormality. PANCREAS: No acute abnormality. ADRENAL GLANDS: No acute abnormality. KIDNEYS, URETERS AND BLADDER: Bilateral renal cysts, largest measuring up to 3.5 cm diameter. No imaging follow-up is indicated. No stones in the kidneys or ureters. No hydronephrosis. No perinephric or periureteral stranding. 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 GI AND BOWEL: The stomach, small bowel, and colon are not abnormally distended. No wall thickening or inflammatory infiltration is demonstrated. Scattered colonic diverticula without evidence of acute diverticulitis. The appendix is normal. There is a posterior perianal collection with an air-fluid level measuring 3.4 cm diameter. This appears to be posterior to the anorectal junction and likely represents a perianal abscess. Posterior rectocele would be less likely. PERITONEUM AND RETROPERITONEUM: No ascites. No free air. VASCULATURE: Aorta is normal in caliber. Calcification of the aorta. LYMPH NODES: No lymphadenopathy. 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 BONES AND SOFT TISSUES: Degenerative changes in the spine. 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 No acute osseous abnormality. IMPRESSION: 1. Posterior perianal collection with an air-fluid level measuring 3.4 cm, most consistent with a perianal abscess. Posterior rectocele is less likely. 2. Scattered colonic diverticula without evidence of acute diverticulitis. Electronically signed by: Elsie Gravely MD 04/11/2024 04:52 PM EST RP Workstation: [id number]   _______________________________________________________________________________________________________ Latest  Blood pressure (!) 177/94, pulse 85, temperature 98.9 F (37.2 C), temperature  source Oral, resp. rate 19, SpO2 96%.   Vitals  labs and radiology finding personally reviewed  Review of Systems:    Pertinent positives include:  rectal pain   Constitutional:  No weight loss, night sweats, Fevers, chills, fatigue, weight loss  HEENT:  No headaches, Difficulty swallowing,Tooth/dental problems,Sore throat,  No sneezing, itching, ear ache, nasal congestion, post nasal drip,  Cardio-vascular:  No chest pain, Orthopnea, PND, anasarca, dizziness, palpitations.no Bilateral lower extremity swelling  GI:  No heartburn, indigestion, abdominal pain, nausea, vomiting, diarrhea, change in bowel habits, loss of appetite, melena, blood in stool, hematemesis Resp:  no shortness of breath at rest. No dyspnea on exertion, No excess mucus, no productive cough, No non-productive cough, No coughing up of blood.No change in color of mucus.No wheezing. Skin:  no rash or lesions. No jaundice GU:  no dysuria, change in color of urine, no urgency or frequency. No straining to urinate.  No flank pain.  Musculoskeletal:  No joint pain or no joint swelling. No decreased range of motion. No back pain.  Psych:  No change in mood or affect. No depression or anxiety. No memory loss.  Neuro: no localizing neurological complaints, no tingling, no weakness, no double vision, no gait abnormality, no slurred speech, no confusion  All systems reviewed and apart from HOPI all are negative _______________________________________________________________________________________________ Past Medical History:   Past Medical History:  Diagnosis Date   Left hydrocele    Type 2 diabetes, diet controlled (HCC)       Past Surgical History:  Procedure Laterality Date   HYDROCELE EXCISION Left 08/27/2014   Procedure: LEFT HYDROCELECTOMY ADULT;  Surgeon: Mark Ottelin, MD;  Location: [facility];  Service: Urology;  Laterality: Left;   ORIF RIGHT ANKLE FX  10-22-2004   REMOVAL SYNDESMOTIC  SCREW RIGHT ANKLE  12-26-2004    Social History:  Ambulatory   independently     reports that he has never smoked. He has never used smokeless tobacco. He reports that he does not drink alcohol and does not use drugs.   Family History:   Family History  Family history unknown: Yes   ______________________________________________________________________________________________ Allergies: No Known Allergies   Prior to Admission medications   Medication Sig Start Date End Date Taking? Authorizing Provider  amoxicillin -clavulanate (AUGMENTIN )  875-125 MG tablet Take 1 tablet by mouth every 12 (twelve) hours. 04/10/24   Mannie Fairy DASEN, DO  aspirin 325 MG EC tablet TAKE 1 TABLET BY MOUTH TWICE A DAY 04/02/19   [provider]  benzonatate  (TESSALON ) 100 MG capsule Take 1 capsule (100 mg total) by mouth every 8 (eight) hours. 12/21/20   Petrucelli, Samantha R, PA-C  Blood Glucose Monitoring Suppl (FIFTY50 GLUCOSE METER 2.0) w/Device KIT Use as instructed 06/16/18   [provider]  capsaicin (ZOSTRIX) 0.025 % cream Using gloves apply to feet daily, avoid in between toes. 08/13/19   [provider]  clotrimazole-betamethasone (LOTRISONE) cream APPLY TOPICALLY 2 TIMES DAILY FOR 30 DAYS. 07/02/19   [provider]  ferrous sulfate 325 (65 FE) MG EC tablet Take by mouth. 07/28/19   [provider]  gabapentin  (NEURONTIN ) 300 MG capsule Take by mouth. 09/17/19   [provider]  HYDROcodone -acetaminophen  (NORCO) 5-325 MG tablet Take 1 tablet by mouth every 6 (six) hours as needed (for pain). Patient not taking: Reported on 10/01/2019 01/10/18   Molpus, Norleen, MD  hydrocortisone  (ANUSOL -HC) 2.5 % rectal cream Place 1 Application rectally 2 (two) times daily. 04/11/24   Sponseller, Rebekah R, PA-C  lidocaine  (LIDODERM ) 5 % Place 1 patch onto the skin daily as needed. Apply patch to area most significant pain once per day.  Remove and discard patch within  12 hours of application. 12/21/20   Petrucelli, Samantha R, PA-C  losartan  (COZAAR ) 50 MG tablet Take by mouth. 07/24/19   [provider]  metFORMIN (GLUCOPHAGE) 500 MG tablet Take by mouth. 07/28/19   [provider]  naproxen  (NAPROSYN ) 375 MG tablet Take 1 tablet (375 mg total) by mouth 2 (two) times daily as needed for moderate pain. 12/21/20   Petrucelli, Lucie SAUNDERS, PA-C    ___________________________________________________________________________________________________ Physical Exam:    04/11/2024    8:03 PM 04/11/2024    4:56 PM 04/11/2024    3:16 PM  Vitals with BMI  Systolic 177 172 809  Diastolic 94 86 83  Pulse 85 91 86     1. General:  in No  Acute distress    Well  -appearing 2. Psychological: Alert and   Oriented 3. Head/ENT:    Dry Mucous Membranes                          Head Non traumatic, neck supple                       Poor Dentition 4. SKIN:  decreased Skin turgor,  Skin clean Dry and intact no rash    5. Heart: Regular rate and rhythm no  Murmur, no Rub or gallop 6. Lungs:   , no wheezes or crackles   7. Abdomen: Soft,  non-tender, Non distended   bowel sounds present 8. Lower extremities: no clubbing, cyanosis, no  edema 9. Neurologically Grossly intact, moving all 4 extremities equally   10. MSK: Normal range of motion Rectal exam deferred due to discomfort  Chart has been reviewed  ______________________________________________________________________________________________  Assessment/Plan  68 y.o. male with medical history significant of DM2, hypertension   Admitted for  External hemorrhoid, Perianal abscess    Present on Admission:  Perianal abscess  Essential hypertension  External hemorrhoid   Diabetes mellitus type II, controlled (HCC)  - Order Sensitive  SSI    -  check TSH and HgA1C  - Hold  by mouth medications    Essential hypertension Allow permissive Hypertension  Perianal abscess Start zosyn  Appreciate  general surgery consult N.p.o. postmidnight  External hemorrhoid Appreciate general surgery consult although present this is not likely what is causing the pain Not thrombosed.  General surgery may address during procedure for now supportive management   Other plan as per orders.  DVT prophylaxis:  SCD    Code Status:    Code Status: Not on file FULL CODE  as per patient   I had personally discussed CODE STATUS with patient  ACP   none   Family Communication:   Family not at  Bedside    Diet heart healthy diabetic diet n.p.o. postmidnight   Disposition Plan:     To home once workup is complete and patient is stable   Following barriers for discharge:                                 Pain controlled with PO medications                              able to transition to PO antibiotics                                                       Will need consultants to evaluate patient prior to discharge                                  Consult Orders  (From admission, onward)           Start     Ordered   04/11/24 1943  Consult to hospitalist  Once       Provider:  (Not yet assigned)  Question Answer Comment  Place call to: [facility] [id number] perianal abscess, history of diabetes   Reason for Consult Admit      04/11/24 1943                               Consults called:   Treatment Team:  Kristopher Md, MD  Admission status:  ED Disposition     ED Disposition  Admit   Condition  --   Comment  Hospital Area: [facility] [[id number]]  Level of Care: Telemetry [5]  Admit to tele based on following criteria: Other see comments  Comments: infection  May admit patient to [facility] or [facility] if equivalent level of care is available:: No  Diagnosis: Perianal abscess [828531]  Admitting Physician: Caidan Hubbert [[id number]]  Attending Physician: Aalaya Yadao [[id number]]  Certification:: I certify this patient will need inpatient services  for at least 2 midnights            inpatient     I Expect 2 midnight stay secondary to severity of patient's current illness need for inpatient interventions justified by the following:    Severe lab/radiological/exam abnormalities including:    External hemorrhoid  Perianal abscess    and extensive comorbidities including:   DM2   That are currently affecting medical management.   I expect  patient to be hospitalized for 2 midnights requiring inpatient medical care.  Patient is at high risk for adverse outcome (such as loss of life or disability) if not treated.  Indication for inpatient stay as follows:   severe pain requiring acute inpatient management,  inability to maintain oral hydration   Need for operative/procedural  intervention   Need for IV antibiotics, IV fluids,, IV pain medications,      Level of care     tele  For 12H     Ameen Mostafa 04/11/2024, 8:58 PM    [facility]     after 2 AM please page floor coverage   If 7AM-7PM, please contact the day team taking care of the patient using [email or web address]

## 2024-04-11 NOTE — Assessment & Plan Note (Signed)
 Start zosyn  Appreciate general surgery consult N.p.o. postmidnight

## 2024-04-11 NOTE — Assessment & Plan Note (Signed)
 -  Order Sensitive  SSI     -  check TSH and HgA1C  - Hold by mouth medications*

## 2024-04-11 NOTE — ED Triage Notes (Signed)
 Patient complains of pain in his rectum. States he has been to [facility] twice in last two days due to rectal abscess. States antibiotics are not effective. Rates pain 10/10.

## 2024-04-11 NOTE — ED Provider Notes (Signed)
 [facility] EMERGENCY DEPARTMENT AT [facility] Provider Note   CSN: [id number] Arrival date & time: 04/11/24  9756     Patient presents with: Rectal Pain and Abscess   Joseph Navarro is a 68 y.o. male who presents with concern for worsening perirectal pain.  Was seen yesterday in the emergency department with concern for right buttock/perianal pain and diagnosed with nonthrombosed external hemorrhoid.  Patient was initiated on oral antibiotics for question of developing soft tissue infection in the right buttock there was no clinical evidence on physical exam per preceding ED provider.  He states that at this time he feels like the area has become more swollen and his pain is worsened.  He is taking oral antibiotics but has not tried any over-the-counter analgesia at home.  Patient with history of type 2 diabetes and hypertension.  Patient now experiencing some mild discomfort with bowel movements since his eating.   HPI     Prior to Admission medications   Medication Sig Start Date End Date Taking? Authorizing Provider  amoxicillin -clavulanate (AUGMENTIN ) 875-125 MG tablet Take 1 tablet by mouth every 12 (twelve) hours. 04/10/24   Mannie Fairy DASEN, DO  aspirin 325 MG EC tablet TAKE 1 TABLET BY MOUTH TWICE A DAY 04/02/19   [provider]  benzonatate  (TESSALON ) 100 MG capsule Take 1 capsule (100 mg total) by mouth every 8 (eight) hours. 12/21/20   Petrucelli, Samantha R, PA-C  Blood Glucose Monitoring Suppl (FIFTY50 GLUCOSE METER 2.0) w/Device KIT Use as instructed 06/16/18   [provider]  capsaicin (ZOSTRIX) 0.025 % cream Using gloves apply to feet daily, avoid in between toes. 08/13/19   [provider]  clotrimazole-betamethasone (LOTRISONE) cream APPLY TOPICALLY 2 TIMES DAILY FOR 30 DAYS. 07/02/19   [provider]  ferrous sulfate 325 (65 FE) MG EC tablet Take by mouth. 07/28/19   [provider]  gabapentin  (NEURONTIN )  300 MG capsule Take by mouth. 09/17/19   [provider]  HYDROcodone -acetaminophen  (NORCO) 5-325 MG tablet Take 1 tablet by mouth every 6 (six) hours as needed (for pain). Patient not taking: Reported on 10/01/2019 01/10/18   Molpus, John, MD  lidocaine  (LIDODERM ) 5 % Place 1 patch onto the skin daily as needed. Apply patch to area most significant pain once per day.  Remove and discard patch within 12 hours of application. 12/21/20   Petrucelli, Samantha R, PA-C  losartan  (COZAAR ) 50 MG tablet Take by mouth. 07/24/19   [provider]  metFORMIN (GLUCOPHAGE) 500 MG tablet Take by mouth. 07/28/19   [provider]  naproxen  (NAPROSYN ) 375 MG tablet Take 1 tablet (375 mg total) by mouth 2 (two) times daily as needed for moderate pain. 12/21/20   Petrucelli, Samantha R, PA-C    Allergies: Patient has no known allergies.    Review of Systems  Respiratory: Negative.    Cardiovascular: Negative.   Gastrointestinal: Negative.   Genitourinary:        Rectal pain    Updated Vital Signs BP (!) 140/85   Pulse 73   Temp 98 F (36.7 C)   Resp 16   SpO2 100%   Physical Exam Vitals and nursing note reviewed. Exam conducted with a chaperone present (ED tech Micah).  Constitutional:      Appearance: He is not ill-appearing or toxic-appearing.  HENT:     Head: Normocephalic and atraumatic.     Mouth/Throat:     Mouth: Mucous membranes are moist.  Pharynx: No oropharyngeal exudate or posterior oropharyngeal erythema.  Eyes:     General:        Right eye: No discharge.        Left eye: No discharge.     Conjunctiva/sclera: Conjunctivae normal.  Cardiovascular:     Rate and Rhythm: Normal rate and regular rhythm.     Pulses: Normal pulses.     Heart sounds: Normal heart sounds.  Pulmonary:     Effort: Pulmonary effort is normal. No respiratory distress.     Breath sounds: Normal breath sounds. No wheezing or rales.  Abdominal:     General: Bowel sounds are normal.  There is no distension.     Palpations: Abdomen is soft.     Tenderness: There is no abdominal tenderness.  Genitourinary:     Comments: No perirectal or buttock erythema, induration, or fluctuance.  Patient has no tenderness to the right ischial area on my exam. Musculoskeletal:        General: No deformity.     Cervical back: Neck supple.  Skin:    General: Skin is warm and dry.     Capillary Refill: Capillary refill takes less than 2 seconds.  Neurological:     General: No focal deficit present.     Mental Status: He is alert and oriented to person, place, and time. Mental status is at baseline.  Psychiatric:        Mood and Affect: Mood normal.     (all labs ordered are listed, but only abnormal results are displayed) Labs Reviewed  COMPREHENSIVE METABOLIC PANEL WITH GFR - Abnormal; Notable for the following components:      Result Value   Glucose, Bld 166 (*)    All other components within normal limits  CBC    EKG: None  Radiology: No results found.   Procedures   Medications Ordered in the ED - No data to display                                  Medical Decision Making 68 year old male presents with rectal pain.  Hypertensive on intake vitals otherwise normal.  Cardiopulmonary exam unremarkable, abdominal exam is benign.  GU exam as above with nonthrombosed external hemorrhoid.  Amount and/or Complexity of Data Reviewed Labs: ordered.    Details: CBC unremarkable, CMP unremarkable  Risk Prescription drug management.   Clinical picture most consistent with nonthrombosed external hemorrhoid.  Will recommend topical steroid cream, symptom management with OTC Tucks spray/pads.  Recommend bowel regimen at home to prevent constipation.  Recommend close outpatient follow-up.  Clinical concern for emergent underlying condition that would warrant further ED workup and patient management is exceedingly low.  Kadar voiced understanding of his medical evaluation  and treatment plan. Each of their questions answered to their expressed satisfaction.  Return precautions were given.  Patient is well-appearing, stable, and was discharged in good condition.  This chart was dictated using voice recognition software, Dragon. Despite the best efforts of this provider to proofread and correct errors, errors may still occur which can change documentation meaning.      Final diagnoses:  None    ED Discharge Orders     None          Bobette Pleasant JONELLE DEVONNA 04/11/24 0705    Griselda Norris, MD 04/11/24 [phone]

## 2024-04-11 NOTE — Subjective & Objective (Signed)
 Patient presents yesterday with rectal pain was diagnosed with nonthrombosed  external hemorrhoids He was seen in the emergency department for the same and prescribed antibiotics and pain medications he felt like he was getting worse and came back Reports discomfort with bowel movement Patient has been prescribed Augmentin  he is diabetic Patient initially was discharged to home again with recommended to use Tucks came back again saying antibiotics are not helping his pain. No leukocytosis earlier today CT scan done showing posterior perianal area fluid collection measuring 3.4 cm consistent with perianal abscess General Surgery was consulted plan for OR either tomorrow or next day

## 2024-04-11 NOTE — Consult Note (Signed)
 CC: My bottom has been hurting  Requesting provider: Dr. Zackowski  HPI: Joseph Navarro is an 68 y.o. male past medical history of diabetes mellitus, hypertension comes back to the emergency room for the third time in about 36 hours for persistent perineal perianal pain.  They thought he might be developing a perianal abscess and they sent him home with Augmentin .  He states that his pain is persistent and worsening.  No fever or chills.  No dysuria or trouble voiding.  He was also found to have a nonthrombosed external hemorrhoid on exam in the emergency room as well.  He works in aeronautical engineer.  He states that he takes metformin for his diabetes.  No tobacco or alcohol use.  No unexplained weight loss.  No melena hematochezia.  Been a little bit uncomfortable to have bowel movements due to the pain in his bottom.  Past Medical History:  Diagnosis Date   Left hydrocele    Type 2 diabetes, diet controlled (HCC)     Past Surgical History:  Procedure Laterality Date   HYDROCELE EXCISION Left 08/27/2014   Procedure: LEFT HYDROCELECTOMY ADULT;  Surgeon: Mark Ottelin, MD;  Location: [facility];  Service: Urology;  Laterality: Left;   ORIF RIGHT ANKLE FX  10-22-2004   REMOVAL SYNDESMOTIC SCREW RIGHT ANKLE  12-26-2004    Family History  Family history unknown: Yes    Social:  reports that he has never smoked. He has never used smokeless tobacco. He reports that he does not drink alcohol and does not use drugs.  Allergies: No Known Allergies  Medications: I have reviewed the patient's current medications.   ROS - all of the below systems have been reviewed with the patient and positives are indicated with bold text General: chills, fever or night sweats Eyes: blurry vision or double vision ENT: epistaxis or sore throat Allergy/Immunology: itchy/watery eyes or nasal congestion Hematologic/Lymphatic: bleeding problems, blood clots or swollen lymph nodes Endocrine:  temperature intolerance or unexpected weight changes Breast: new or changing breast lumps or nipple discharge Resp: cough, shortness of breath, or wheezing CV: chest pain or dyspnea on exertion GI: as per HPI GU: dysuria, trouble voiding, or hematuria MSK: joint pain or joint stiffness Neuro: TIA or stroke symptoms Derm: pruritus and skin lesion changes Psych: anxiety and depression  PE -chaperone present-nurse tech at bedside Blood pressure (!) 177/94, pulse 85, temperature 98.9 F (37.2 C), temperature source Oral, resp. rate 19, SpO2 96%. Constitutional: NAD; conversant; no deformities Eyes: Moist conjunctiva; no lid lag; anicteric; PERRL Neck: Trachea midline; no thyromegaly Lungs: Normal respiratory effort; no tactile fremitus CV: RRR; no palpable thrills; no pitting edema GI: Abd soft, nontender, nondistended; no palpable hepatosplenomegaly GU:-Limited due to patient discomfort.  Visual inspection reveals a slightly engorged but nonthrombosed posterior external hemorrhoid.  When I pressed on it there appears to be a opening that is spontaneously draining some air and seropurulent fluid.  Digital rectal exam was not able to be performed due to patient discomfort MSK:  no clubbing/cyanosis Psychiatric: Appropriate affect; alert and oriented x3 Lymphatic: No palpable cervical or axillary lymphadenopathy Skin: No rash or lesions  Results for orders placed or performed during the hospital encounter of 04/11/24 (from the past 48 hours)  CBG monitoring, ED     Status: Abnormal   Collection Time: 04/11/24  8:38 PM  Result Value Ref Range   Glucose-Capillary 162 (H) 70 - 99 mg/dL    Comment: Glucose reference range applies only to  samples taken after fasting for at least 8 hours.      Latest Ref Rng & Units 04/11/2024    3:02 AM 12/20/2020    7:22 PM 08/27/2014    9:21 AM  CBC  WBC 4.0 - 10.5 K/uL 10.1  6.1    Hemoglobin 13.0 - 17.0 g/dL 86.5  87.7  84.6   Hematocrit 39.0 - 52.0 %  40.9  37.9  45.0   Platelets 150 - 400 K/uL 351  321        Latest Ref Rng & Units 04/11/2024    3:02 AM 12/20/2020    7:22 PM 08/27/2014    9:21 AM  BMP  Glucose 70 - 99 mg/dL 833  873  752   BUN 8 - 23 mg/dL 9  16  14    Creatinine 0.61 - 1.24 mg/dL 9.00  9.03  9.19   Sodium 135 - 145 mmol/L 136  139  138   Potassium 3.5 - 5.1 mmol/L 3.7  4.3  4.6   Chloride 98 - 111 mmol/L 100  104  101   CO2 22 - 32 mmol/L 25  29    Calcium 8.9 - 10.3 mg/dL 9.7  9.6       CT ABDOMEN PELVIS W CONTRAST Result Date: 04/11/2024 EXAM: CT ABDOMEN AND PELVIS WITH CONTRAST 04/11/2024 04:44:59 PM TECHNIQUE: CT of the abdomen and pelvis was performed with the administration of 100 mL of iohexol  (OMNIPAQUE ) 300 MG/ML solution. Multiplanar reformatted images are provided for review. Automated exposure control, iterative reconstruction, and/or weight-based adjustment of the mA/kV was utilized to reduce the radiation dose to as low as reasonably achievable. COMPARISON: None available. CLINICAL HISTORY: Abdominal pain, acute, nonlocalized; Rectal pain. FINDINGS: LOWER CHEST: Prominent atelectasis in the lung bases. Motion artifact limits examination. LIVER: The liver is unremarkable. GALLBLADDER AND BILE DUCTS: Gallbladder is unremarkable. No biliary ductal dilatation. SPLEEN: No acute abnormality. PANCREAS: No acute abnormality. ADRENAL GLANDS: No acute abnormality. KIDNEYS, URETERS AND BLADDER: Bilateral renal cysts, largest measuring up to 3.5 cm diameter. No imaging follow-up is indicated. No stones in the kidneys or ureters. No hydronephrosis. No perinephric or periureteral stranding. 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 GI AND BOWEL: The stomach, small bowel, and colon are not abnormally distended. No wall thickening or inflammatory infiltration is demonstrated. Scattered colonic diverticula without evidence of acute diverticulitis. The appendix  is normal. There is a posterior perianal collection with an air-fluid level measuring 3.4 cm diameter. This appears to be posterior to the anorectal junction and likely represents a perianal abscess. Posterior rectocele would be less likely. PERITONEUM AND RETROPERITONEUM: No ascites. No free air. VASCULATURE: Aorta is normal in caliber. Calcification of the aorta. LYMPH NODES: No lymphadenopathy. 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 BONES AND SOFT TISSUES: Degenerative changes in the spine. 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 No acute osseous abnormality. IMPRESSION: 1. Posterior perianal collection with an air-fluid level measuring 3.4 cm, most consistent with a perianal abscess. Posterior rectocele is less likely. 2. Scattered colonic diverticula without evidence of acute diverticulitis. Electronically signed by: Elsie Gravely MD 04/11/2024 04:52 PM EST RP Workstation: [id number]    Imaging: Personally reviewed  A/P: Avondre Richens is an 68 y.o. male with Posterior external hemorrhoid Posterior perianal abscess Hypertension Diabetes mellitus type 2  It is possible  he is spontaneously draining this perianal abscess out through his external hemorrhoid but I do not think it is adequately being evacuated.  I think he will need exam under anesthesia with incision and drainage and possible excisional hemorrhoidectomy.  At least on limited physical exam I do not think this is a rectocele since he is actively draining seropurulent fluid  Admit to medicine given his diabetes.  He had an A1c of over 9 at an outside facility earlier this year IV antibiotic He can have a diet tonight but n.p.o. except meds after midnight Okay with chemical VTE prophylaxis Diabetes management  Will try to do this procedure tomorrow in the operating room.   Discussed the procedure with the patient occluding risk and benefits such as bleeding, infection, injury to surrounding structures, recurrence, the fact that it may drain for a while and that it would have to heal from the inside out, delayed wound healing if his diabetes is uncontrolled, perioperative cardiac and pulmonary events, blood clot formation  Data reviewed: ED notes from November 21 and today, labs from earlier, A1c from spring 2025, personally reviewed his CT scan  High Medical decision made  Camellia HERO. Tanda, MD, FACS General, Bariatric, & Minimally Invasive Surgery [facility]

## 2024-04-12 ENCOUNTER — Encounter (HOSPITAL_COMMUNITY)
Admission: EM | Disposition: A | Discharge status: Home / Self Care | Payer: Self-pay | Source: Home / Self Care | Attending: Internal Medicine

## 2024-04-12 ENCOUNTER — Inpatient Hospital Stay (HOSPITAL_COMMUNITY): Admitting: Certified Registered"

## 2024-04-12 ENCOUNTER — Encounter (HOSPITAL_COMMUNITY): Payer: Self-pay | Admitting: Internal Medicine

## 2024-04-12 DIAGNOSIS — K610 Anal abscess: Secondary | ICD-10-CM | POA: Diagnosis not present

## 2024-04-12 DIAGNOSIS — K644 Residual hemorrhoidal skin tags: Secondary | ICD-10-CM | POA: Diagnosis not present

## 2024-04-12 DIAGNOSIS — I10 Essential (primary) hypertension: Secondary | ICD-10-CM | POA: Diagnosis not present

## 2024-04-12 DIAGNOSIS — K603 Anal fistula, unspecified: Secondary | ICD-10-CM

## 2024-04-12 DIAGNOSIS — E119 Type 2 diabetes mellitus without complications: Secondary | ICD-10-CM | POA: Diagnosis not present

## 2024-04-12 LAB — URINALYSIS, COMPLETE (UACMP) WITH MICROSCOPIC
Bacteria, UA: NONE SEEN
Bilirubin Urine: NEGATIVE
Glucose, UA: 150 mg/dL — AB
Ketones, ur: 20 mg/dL — AB
Leukocytes,Ua: NEGATIVE
Nitrite: NEGATIVE
Protein, ur: 30 mg/dL — AB
Specific Gravity, Urine: 1.038 — ABNORMAL HIGH (ref 1.005–1.030)
pH: 5 (ref 5.0–8.0)

## 2024-04-12 LAB — CBC
HCT: 37.3 % — ABNORMAL LOW (ref 39.0–52.0)
Hemoglobin: 12.5 g/dL — ABNORMAL LOW (ref 13.0–17.0)
MCH: 31.2 pg (ref 26.0–34.0)
MCHC: 33.5 g/dL (ref 30.0–36.0)
MCV: 93 fL (ref 80.0–100.0)
Platelets: 318 K/uL (ref 150–400)
RBC: 4.01 MIL/uL — ABNORMAL LOW (ref 4.22–5.81)
RDW: 12.5 % (ref 11.5–15.5)
WBC: 11.2 K/uL — ABNORMAL HIGH (ref 4.0–10.5)
nRBC: 0 % (ref 0.0–0.2)

## 2024-04-12 LAB — PHOSPHORUS: Phosphorus: 2.8 mg/dL (ref 2.5–4.6)

## 2024-04-12 LAB — SURGICAL PCR SCREEN
MRSA, PCR: NEGATIVE
Staphylococcus aureus: NEGATIVE

## 2024-04-12 LAB — COMPREHENSIVE METABOLIC PANEL WITH GFR
ALT: 18 U/L (ref 0–44)
AST: 14 U/L — ABNORMAL LOW (ref 15–41)
Albumin: 3.6 g/dL (ref 3.5–5.0)
Alkaline Phosphatase: 71 U/L (ref 38–126)
Anion gap: 9 (ref 5–15)
BUN: 8 mg/dL (ref 8–23)
CO2: 27 mmol/L (ref 22–32)
Calcium: 9.3 mg/dL (ref 8.9–10.3)
Chloride: 101 mmol/L (ref 98–111)
Creatinine, Ser: 0.92 mg/dL (ref 0.61–1.24)
GFR, Estimated: >60 mL/min (ref >60)
Glucose, Bld: 152 mg/dL — ABNORMAL HIGH (ref 70–99)
Potassium: 4.1 mmol/L (ref 3.5–5.1)
Sodium: 137 mmol/L (ref 135–145)
Total Bilirubin: 0.4 mg/dL (ref 0.0–1.2)
Total Protein: 7.3 g/dL (ref 6.5–8.1)

## 2024-04-12 LAB — GLUCOSE, CAPILLARY
Glucose-Capillary: 117 mg/dL — ABNORMAL HIGH (ref 70–99)
Glucose-Capillary: 145 mg/dL — ABNORMAL HIGH (ref 70–99)
Glucose-Capillary: 189 mg/dL — ABNORMAL HIGH (ref 70–99)
Glucose-Capillary: 220 mg/dL — ABNORMAL HIGH (ref 70–99)
Glucose-Capillary: 357 mg/dL — ABNORMAL HIGH (ref 70–99)

## 2024-04-12 LAB — HIV ANTIBODY (ROUTINE TESTING W REFLEX): HIV Screen 4th Generation wRfx: NONREACTIVE

## 2024-04-12 LAB — HEMOGLOBIN A1C
Hgb A1c MFr Bld: 8.6 % — ABNORMAL HIGH (ref 4.8–5.6)
Mean Plasma Glucose: 200.12 mg/dL

## 2024-04-12 LAB — MAGNESIUM: Magnesium: 2.2 mg/dL (ref 1.7–2.4)

## 2024-04-12 SURGERY — EXAM UNDER ANESTHESIA, RECTUM
Anesthesia: General | Site: Rectum

## 2024-04-12 MED ORDER — PROPOFOL 10 MG/ML IV BOLUS
INTRAVENOUS | Status: DC | PRN
  Administered 2024-04-12: 150 mg via INTRAVENOUS
  Administered 2024-04-12: 50 mg via INTRAVENOUS

## 2024-04-12 MED ORDER — LACTATED RINGERS IV SOLN: INTRAVENOUS | Status: DC | PRN

## 2024-04-12 MED ORDER — LIDOCAINE HCL (PF) 2 % IJ SOLN
INTRAMUSCULAR | Status: AC
  Filled 2024-04-12: qty 5

## 2024-04-12 MED ORDER — ONDANSETRON HCL 4 MG/2ML IJ SOLN
INTRAMUSCULAR | Status: AC
  Filled 2024-04-12: qty 2

## 2024-04-12 MED ORDER — FENTANYL CITRATE (PF) 100 MCG/2ML IJ SOLN
INTRAMUSCULAR | Status: AC
  Filled 2024-04-12: qty 2

## 2024-04-12 MED ORDER — SENNOSIDES-DOCUSATE SODIUM 8.6-50 MG PO TABS
1.0000 | ORAL_TABLET | Freq: Two times a day (BID) | ORAL | Status: DC
  Administered 2024-04-12 – 2024-04-15 (×3): 1 via ORAL
  Filled 2024-04-12 (×6): qty 1

## 2024-04-12 MED ORDER — PIPERACILLIN-TAZOBACTAM 3.375 G IVPB
INTRAVENOUS | Status: AC
  Filled 2024-04-12: qty 50

## 2024-04-12 MED ORDER — 0.9 % SODIUM CHLORIDE (POUR BTL) OPTIME
TOPICAL | Status: DC | PRN
  Administered 2024-04-12: 1000 mL

## 2024-04-12 MED ORDER — FENTANYL CITRATE (PF) 50 MCG/ML IJ SOSY: 25.0000 ug | PREFILLED_SYRINGE | INTRAMUSCULAR | Status: DC | PRN

## 2024-04-12 MED ORDER — KETOROLAC TROMETHAMINE 30 MG/ML IJ SOLN
15.0000 mg | Freq: Once | INTRAMUSCULAR | Status: AC
  Administered 2024-04-12: 15 mg via INTRAVENOUS

## 2024-04-12 MED ORDER — MIDAZOLAM HCL (PF) 2 MG/2ML IJ SOLN
INTRAMUSCULAR | Status: DC | PRN
  Administered 2024-04-12: 2 mg via INTRAVENOUS

## 2024-04-12 MED ORDER — MORPHINE SULFATE (PF) 2 MG/ML IV SOLN
1.0000 mg | INTRAVENOUS | Status: DC | PRN
  Administered 2024-04-13: 2 mg via INTRAVENOUS
  Filled 2024-04-12: qty 1

## 2024-04-12 MED ORDER — ACETAMINOPHEN 10 MG/ML IV SOLN
INTRAVENOUS | Status: AC
  Filled 2024-04-12: qty 100

## 2024-04-12 MED ORDER — DEXAMETHASONE SOD PHOSPHATE PF 10 MG/ML IJ SOLN
INTRAMUSCULAR | Status: DC | PRN
  Administered 2024-04-12: 4 mg via INTRAVENOUS

## 2024-04-12 MED ORDER — SODIUM CHLORIDE 0.9 % IV SOLN: INTRAVENOUS | Status: AC

## 2024-04-12 MED ORDER — OXYCODONE HCL 5 MG PO TABS: 5.0000 mg | ORAL_TABLET | Freq: Once | ORAL | Status: DC | PRN

## 2024-04-12 MED ORDER — OXYCODONE HCL 5 MG/5ML PO SOLN: 5.0000 mg | Freq: Once | ORAL | Status: DC | PRN

## 2024-04-12 MED ORDER — LOSARTAN POTASSIUM 50 MG PO TABS
50.0000 mg | ORAL_TABLET | Freq: Every day | ORAL | Status: DC
  Administered 2024-04-13 – 2024-04-15 (×3): 50 mg via ORAL
  Filled 2024-04-12 (×4): qty 1

## 2024-04-12 MED ORDER — FENTANYL CITRATE (PF) 250 MCG/5ML IJ SOLN
INTRAMUSCULAR | Status: DC | PRN
  Administered 2024-04-12 (×4): 50 ug via INTRAVENOUS

## 2024-04-12 MED ORDER — ACETAMINOPHEN 10 MG/ML IV SOLN
1000.0000 mg | Freq: Once | INTRAVENOUS | Status: AC
  Administered 2024-04-12: 1000 mg via INTRAVENOUS

## 2024-04-12 MED ORDER — DROPERIDOL 2.5 MG/ML IJ SOLN: 0.6250 mg | Freq: Once | INTRAMUSCULAR | Status: DC | PRN

## 2024-04-12 MED ORDER — LIDOCAINE HCL (CARDIAC) PF 100 MG/5ML IV SOSY
PREFILLED_SYRINGE | INTRAVENOUS | Status: DC | PRN
  Administered 2024-04-12: 50 mg via INTRAVENOUS

## 2024-04-12 MED ORDER — PROPOFOL 10 MG/ML IV BOLUS
INTRAVENOUS | Status: AC
  Filled 2024-04-12: qty 20

## 2024-04-12 MED ORDER — MIDAZOLAM HCL 2 MG/2ML IJ SOLN
INTRAMUSCULAR | Status: AC
  Filled 2024-04-12: qty 2

## 2024-04-12 MED ORDER — HYDRALAZINE HCL 20 MG/ML IJ SOLN: 10.0000 mg | Freq: Four times a day (QID) | INTRAMUSCULAR | Status: DC | PRN

## 2024-04-12 MED ORDER — SODIUM CHLORIDE 0.9 % IV SOLN: INTRAVENOUS | Status: DC

## 2024-04-12 MED ORDER — KETOROLAC TROMETHAMINE 15 MG/ML IJ SOLN
INTRAMUSCULAR | Status: AC
  Filled 2024-04-12: qty 1

## 2024-04-12 MED ORDER — SUGAMMADEX SODIUM 200 MG/2ML IV SOLN
INTRAVENOUS | Status: AC
Start: 2024-04-12 — End: 2024-04-12
  Filled 2024-04-12: qty 2

## 2024-04-12 SURGICAL SUPPLY — 33 items
BAG COUNTER SPONGE SURGICOUNT (BAG) IMPLANT
BENZOIN TINCTURE PRP APPL 2/3 (GAUZE/BANDAGES/DRESSINGS) IMPLANT
COVER SURGICAL LIGHT HANDLE (MISCELLANEOUS) ×2 IMPLANT
DERMABOND ADVANCED .7 DNX12 (GAUZE/BANDAGES/DRESSINGS) IMPLANT
DRAPE LAPAROSCOPIC ABDOMINAL (DRAPES) IMPLANT
DRAPE LAPAROTOMY T 102X78X121 (DRAPES) IMPLANT
DRAPE LAPAROTOMY T 98X78 PEDS (DRAPES) IMPLANT
DRAPE LAPAROTOMY TRNSV 102X78 (DRAPES) IMPLANT
DRAPE SHEET LG 3/4 BI-LAMINATE (DRAPES) IMPLANT
DRAPE UTILITY XL STRL (DRAPES) ×2 IMPLANT
ELECT REM PT RETURN 15FT ADLT (MISCELLANEOUS) ×2 IMPLANT
ELECTRODE CAUTERY BLDE TIP 2.5 (TIP) IMPLANT
GAUZE PACKING IODOFORM 1/4X15 (PACKING) IMPLANT
GAUZE SPONGE 4X4 12PLY STRL (GAUZE/BANDAGES/DRESSINGS) ×2 IMPLANT
GLOVE BIO SURGEON STRL SZ7.5 (GLOVE) ×2 IMPLANT
GLOVE INDICATOR 8.0 STRL GRN (GLOVE) ×2 IMPLANT
GOWN STRL REUS W/ TWL XL LVL3 (GOWN DISPOSABLE) ×2 IMPLANT
KIT BASIN OR (CUSTOM PROCEDURE TRAY) ×2 IMPLANT
KIT TURNOVER KIT A (KITS) ×2 IMPLANT
LOOP VESSEL MAXI BLUE (MISCELLANEOUS) IMPLANT
MARKER SKIN DUAL TIP RULER LAB (MISCELLANEOUS) IMPLANT
NDL HYPO 25X1 1.5 SAFETY (NEEDLE) ×2 IMPLANT
NEEDLE HYPO 25X1 1.5 SAFETY (NEEDLE) ×2 IMPLANT
PACK GENERAL/GYN (CUSTOM PROCEDURE TRAY) ×2 IMPLANT
SPIKE FLUID TRANSFER (MISCELLANEOUS) IMPLANT
SPONGE T-LAP 4X18 ~~LOC~~+RFID (SPONGE) IMPLANT
STAPLER SKIN PROX 35W (STAPLE) IMPLANT
SUT MNCRL AB 4-0 PS2 18 (SUTURE) IMPLANT
SUT VIC AB 3-0 SH 18 (SUTURE) IMPLANT
SWAB COLLECTION DEVICE MRSA (MISCELLANEOUS) IMPLANT
SWAB CULTURE ESWAB REG 1ML (MISCELLANEOUS) IMPLANT
SYR CONTROL 10ML LL (SYRINGE) ×2 IMPLANT
TOWEL OR DSP ST BLU DLX 10/PK (DISPOSABLE) ×2 IMPLANT

## 2024-04-12 NOTE — Anesthesia Procedure Notes (Signed)
 Procedure Name: LMA Insertion Date/Time: 04/12/2024 11:25 AM  Performed by: Pheobe Adine CROME, CRNAPre-anesthesia Checklist: Patient identified, Emergency Drugs available, Suction available, Patient being monitored and Timeout performed Patient Re-evaluated:Patient Re-evaluated prior to induction Oxygen Delivery Method: Circle system utilized Preoxygenation: Pre-oxygenation with 100% oxygen Induction Type: IV induction LMA: LMA inserted LMA Size: 5.0 Number of attempts: 1 Placement Confirmation: positive ETCO2, CO2 detector and breath sounds checked- equal and bilateral Tube secured with: Tape Dental Injury: Teeth and Oropharynx as per pre-operative assessment

## 2024-04-12 NOTE — Anesthesia Postprocedure Evaluation (Signed)
 Anesthesia Post Note  Patient: Joseph Navarro  Procedure(s) Performed: ERASMO UNDER ANESTHESIA, PERIANAL ABSCESS (Rectum) INCISION AND DRAINAGE, ABSCESS, PERIANAL     Patient location during evaluation: PACU Anesthesia Type: General Level of consciousness: awake and alert Pain management: pain level controlled Vital Signs Assessment: post-procedure vital signs reviewed and stable Respiratory status: spontaneous breathing, nonlabored ventilation, respiratory function stable and patient connected to nasal cannula oxygen Cardiovascular status: blood pressure returned to baseline and stable Postop Assessment: no apparent nausea or vomiting Anesthetic complications: no   No notable events documented.  Last Vitals:  Vitals:   04/12/24 1250 04/12/24 1404  BP: (!) 152/83 (!) 180/99  Pulse: 66 78  Resp: 15   Temp: 37.2 C   SpO2: 100% 96%    Last Pain:  Vitals:   04/12/24 1230  TempSrc:   PainSc: 0-No pain                 Cordella P Ed Rayson

## 2024-04-12 NOTE — Plan of Care (Signed)
   Problem: Coping: Goal: Ability to adjust to condition or change in health will improve Outcome: Progressing

## 2024-04-12 NOTE — Anesthesia Preprocedure Evaluation (Addendum)
 Anesthesia Evaluation  Patient identified by MRN, date of birth, ID band Patient awake    Reviewed: Allergy & Precautions, NPO status , Patient's Chart, lab work & pertinent test results  Airway Mallampati: II  TM Distance: >3 FB Neck ROM: Full    Dental no notable dental hx.    Pulmonary neg pulmonary ROS   Pulmonary exam normal        Cardiovascular hypertension, Pt. on medications  Rhythm:Regular Rate:Normal     Neuro/Psych negative neurological ROS  negative psych ROS   GI/Hepatic negative GI ROS, Neg liver ROS,,,Hemorrhoids    Endo/Other  diabetes, Type 2, Oral Hypoglycemic Agents    Renal/GU      Musculoskeletal  (+) Arthritis , Osteoarthritis,    Abdominal Normal abdominal exam  (+)   Peds  Hematology   Anesthesia Other Findings   Reproductive/Obstetrics                              Anesthesia Physical Anesthesia Plan  ASA: 2  Anesthesia Plan: General   Post-op Pain Management: Tylenol  PO (pre-op)*   Induction: Intravenous  PONV Risk Score and Plan: 2 and Ondansetron , Dexamethasone  and Treatment may vary due to age or medical condition  Airway Management Planned: Mask and LMA  Additional Equipment: None  Intra-op Plan:   Post-operative Plan: Extubation in OR  Informed Consent: I have reviewed the patients History and Physical, chart, labs and discussed the procedure including the risks, benefits and alternatives for the proposed anesthesia with the patient or authorized representative who has indicated his/her understanding and acceptance.       Plan Discussed with: CRNA  Anesthesia Plan Comments:         Anesthesia Quick Evaluation

## 2024-04-12 NOTE — Progress Notes (Signed)
 PROGRESS NOTE    Joseph Navarro  FMW:[id number] DOB: 1955-07-01 DOA: 04/11/2024 PCP: Tammy Tari DASEN, PA-C    No chief complaint on file.   Brief Narrative:  Patient 68 year old gentleman history of type 2 diabetes, hypertension presented to the ED with rectal pain x 3 days.  Patient seen in the ED 1 day prior to admission for rectal pain diagnosed with nonthrombosed external hemorrhoids prescribed antibiotics and discharged home however presented back with no significant improvement.  CT abdomen and pelvis done with concerns for perianal abscess.  General surgery consulted.  Patient to the OR for I and D, 04/12/2024   Assessment & Plan:   Principal Problem:   Perianal abscess Active Problems:   Diabetes mellitus type II, controlled (HCC)   Essential hypertension   External hemorrhoid  #1 perianal abscess/posterior external hemorrhoid -Patient presented with rectal pain x 3 days. - CT abdomen and pelvis done concerning for perianal abscess. - Patient seen in consultation by general surgery who feel patient may have a spontaneously draining perianal abscess out through external hemorrhoid which has not been adequately evacuated. - General Surgery recommending incision and drainage under anesthesia and possible excisional hemorrhoidectomy. - Patient to OR today for I&D. - Continue empiric IV Zosyn . - Continue pain management. - Per general surgery.  2.  Diabetes mellitus type 2 -Hemoglobin A1c 8.6. - CBG noted at 145 this morning. - Patient noted on metformin prior to admission. - Hold oral hypoglycemic agents. - SSI.  3.  Hypertension -Resume home regimen Cozaar .     DVT prophylaxis: SCDs Code Status: Full Family Communication: Updated patient. Disposition: Home when clinically improved and cleared by general surgery.  Status is: Inpatient Remains inpatient appropriate because: Severity of illness   Consultants:  General Surgery: Dr. Tanda  04/11/2024   Procedures:  CT abdomen and pelvis 04/11/2024   Antimicrobials:  Anti-infectives (From admission, onward)    Start     Dose/Rate Route Frequency Ordered Stop   04/12/24 1102  piperacillin -tazobactam (ZOSYN ) 3.375 GM/50ML IVPB       Note to Pharmacy: Pheobe Cain L: cabinet override      04/12/24 1102 04/12/24 1133   04/12/24 0400  piperacillin -tazobactam (ZOSYN ) IVPB 3.375 g        3.375 g 12.5 mL/hr over 240 Minutes Intravenous Every 8 hours 04/11/24 2102     04/11/24 2030  piperacillin -tazobactam (ZOSYN ) IVPB 3.375 g        3.375 g 100 mL/hr over 30 Minutes Intravenous  Once 04/11/24 2026 04/11/24 2148         Subjective: Patient in preop area.  Complaining of perianal/perirectal pain.  Denies any chest pain or shortness of breath.  No abdominal pain.  Objective: Vitals:   04/12/24 0136 04/12/24 0426 04/12/24 0624 04/12/24 1027  BP: (!) 178/90 (!) 182/91 (!) 163/88 139/73  Pulse: 75 81 69 73  Resp: 18 16  14   Temp: 98.2 F (36.8 C) 97.9 F (36.6 C)  98.6 F (37 C)  TempSrc: Oral Oral    SpO2: 97% 98%  91%  Weight:      Height:        Intake/Output Summary (Last 24 hours) at 04/12/2024 1108 Last data filed at 04/12/2024 1000 Gross per 24 hour  Intake 547.5 ml  Output 300 ml  Net 247.5 ml   Filed Weights   04/11/24 2223  Weight: 88.5 kg    Examination:  General exam: Appears calm and comfortable  Respiratory system: Clear to auscultation.  Respiratory effort normal. Cardiovascular system: S1 & S2 heard, RRR. No JVD, murmurs, rubs, gallops or clicks. No pedal edema. Gastrointestinal system: Abdomen is nondistended, soft and nontender. No organomegaly or masses felt. Normal bowel sounds heard. Central nervous system: Alert and oriented. No focal neurological deficits. Extremities: Symmetric 5 x 5 power. Skin: No rashes, lesions or ulcers Psychiatry: Judgement and insight appear normal. Mood & affect appropriate.     Data Reviewed: I  have personally reviewed following labs and imaging studies  CBC: Recent Labs  Lab 04/11/24 0302 04/12/24 0529  WBC 10.1 11.2*  HGB 13.4 12.5*  HCT 40.9 37.3*  MCV 92.7 93.0  PLT 351 318    Basic Metabolic Panel: Recent Labs  Lab 04/11/24 0302 04/12/24 0529  NA 136 137  K 3.7 4.1  CL 100 101  CO2 25 27  GLUCOSE 166* 152*  BUN 9 8  CREATININE 0.99 0.92  CALCIUM 9.7 9.3  MG  --  2.2  PHOS  --  2.8    GFR: Estimated Creatinine Clearance: 86.9 mL/min (by C-G formula based on SCr of 0.92 mg/dL).  Liver Function Tests: Recent Labs  Lab 04/11/24 0302 04/12/24 0529  AST 21 14*  ALT 24 18  ALKPHOS 66 71  BILITOT 0.5 0.4  PROT 8.0 7.3  ALBUMIN 4.3 3.6    CBG: Recent Labs  Lab 04/11/24 2038 04/11/24 2337 04/12/24 0424 04/12/24 0726  GLUCAP 162* 167* 145* 117*     Recent Results (from the past 240 hours)  Surgical pcr screen     Status: None   Collection Time: 04/12/24  9:18 AM   Specimen: Nasal Mucosa; Nasal Swab  Result Value Ref Range Status   MRSA, PCR NEGATIVE NEGATIVE Final   Staphylococcus aureus NEGATIVE NEGATIVE Final    Comment: (NOTE) The Xpert SA Assay (FDA approved for NASAL specimens in patients 27 years of age and older), is one component of a comprehensive surveillance program. It is not intended to diagnose infection nor to guide or monitor treatment. Performed at [facility], [address] [place], [place] [address]          Radiology Studies: CT ABDOMEN PELVIS W CONTRAST Result Date: 04/11/2024 EXAM: CT ABDOMEN AND PELVIS WITH CONTRAST 04/11/2024 04:44:59 PM TECHNIQUE: CT of the abdomen and pelvis was performed with the administration of 100 mL of iohexol  (OMNIPAQUE ) 300 MG/ML solution. Multiplanar reformatted images are provided for review. Automated exposure control, iterative reconstruction, and/or weight-based adjustment of the mA/kV was utilized to reduce the radiation dose to as low as reasonably  achievable. COMPARISON: None available. CLINICAL HISTORY: Abdominal pain, acute, nonlocalized; Rectal pain. FINDINGS: LOWER CHEST: Prominent atelectasis in the lung bases. Motion artifact limits examination. LIVER: The liver is unremarkable. GALLBLADDER AND BILE DUCTS: Gallbladder is unremarkable. No biliary ductal dilatation. SPLEEN: No acute abnormality. PANCREAS: No acute abnormality. ADRENAL GLANDS: No acute abnormality. KIDNEYS, URETERS AND BLADDER: Bilateral renal cysts, largest measuring up to 3.5 cm diameter. No imaging follow-up is indicated. No stones in the kidneys or ureters. No hydronephrosis. No perinephric or periureteral stranding. 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 GI AND BOWEL: The stomach, small bowel, and colon are not abnormally distended. No wall thickening or inflammatory infiltration is demonstrated. Scattered colonic diverticula without evidence of acute diverticulitis. The appendix is normal. There is a posterior perianal collection with an air-fluid level measuring 3.4 cm diameter. This appears to be posterior to the anorectal  junction and likely represents a perianal abscess. Posterior rectocele would be less likely. PERITONEUM AND RETROPERITONEUM: No ascites. No free air. VASCULATURE: Aorta is normal in caliber. Calcification of the aorta. LYMPH NODES: No lymphadenopathy. 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 BONES AND SOFT TISSUES: Degenerative changes in the spine. 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 No acute osseous abnormality. IMPRESSION: 1. Posterior perianal collection with an air-fluid level measuring 3.4 cm, most consistent with a perianal abscess. Posterior rectocele is less likely. 2. Scattered colonic diverticula without evidence of acute  diverticulitis. Electronically signed by: Elsie Gravely MD 04/11/2024 04:52 PM EST RP Workstation: [id number]        Scheduled Meds:  [MAR Hold] insulin  aspart  0-9 Units Subcutaneous Q4H   [MAR Hold] losartan   50 mg Oral Daily   [MAR Hold] senna-docusate  1 tablet Oral BID   Continuous Infusions:  sodium chloride  125 mL/hr at 04/12/24 0840   piperacillin -tazobactam     [MAR Hold] piperacillin -tazobactam (ZOSYN )  IV 3.375 g (04/12/24 0441)     LOS: 1 day    Time spent: 40 minutes    Toribio Hummer, MD [facility]   To contact the attending provider between 7A-7P or the covering provider during after hours 7P-7A, please log into the web site [email or web address] and access using universal  password for that web site. If you do not have the password, please call the hospital operator.  04/12/2024, 11:08 AM

## 2024-04-12 NOTE — Progress Notes (Signed)
 * Day of Surgery *   Subjective/Chief Complaint: Perianal pain, smells   Objective: Vital signs in last 24 hours: Temp:  [97.9 F (36.6 C)-99.2 F (37.3 C)] 97.9 F (36.6 C) (11/23 0426) Pulse Rate:  [69-91] 69 (11/23 0624) Resp:  [16-19] 16 (11/23 0426) BP: (150-190)/(72-94) 163/88 (11/23 0624) SpO2:  [94 %-98 %] 98 % (11/23 0426) Weight:  [88.5 kg] 88.5 kg (11/22 2223)    Intake/Output from previous day: 11/22 0701 - 11/23 0700 In: 547.5 [I.V.:547.5] Out: 300 [Urine:300] Intake/Output this shift: No intake/output data recorded.  Difficult to examine due to pain, some drainage from near external hemorrhoid  Lab Results:  Recent Labs    04/11/24 0302 04/12/24 0529  WBC 10.1 11.2*  HGB 13.4 12.5*  HCT 40.9 37.3*  PLT 351 318   BMET Recent Labs    04/11/24 0302 04/12/24 0529  NA 136 137  K 3.7 4.1  CL 100 101  CO2 25 27  GLUCOSE 166* 152*  BUN 9 8  CREATININE 0.99 0.92  CALCIUM 9.7 9.3   PT/INR No results for input(s): LABPROT, INR in the last 72 hours. ABG No results for input(s): PHART, HCO3 in the last 72 hours.  Invalid input(s): PCO2, PO2  Studies/Results: CT ABDOMEN PELVIS W CONTRAST Result Date: 04/11/2024 EXAM: CT ABDOMEN AND PELVIS WITH CONTRAST 04/11/2024 04:44:59 PM TECHNIQUE: CT of the abdomen and pelvis was performed with the administration of 100 mL of iohexol  (OMNIPAQUE ) 300 MG/ML solution. Multiplanar reformatted images are provided for review. Automated exposure control, iterative reconstruction, and/or weight-based adjustment of the mA/kV was utilized to reduce the radiation dose to as low as reasonably achievable. COMPARISON: None available. CLINICAL HISTORY: Abdominal pain, acute, nonlocalized; Rectal pain. FINDINGS: LOWER CHEST: Prominent atelectasis in the lung bases. Motion artifact limits examination. LIVER: The liver is unremarkable. GALLBLADDER AND BILE DUCTS: Gallbladder is unremarkable. No biliary ductal dilatation.  SPLEEN: No acute abnormality. PANCREAS: No acute abnormality. ADRENAL GLANDS: No acute abnormality. KIDNEYS, URETERS AND BLADDER: Bilateral renal cysts, largest measuring up to 3.5 cm diameter. No imaging follow-up is indicated. No stones in the kidneys or ureters. No hydronephrosis. No perinephric or periureteral stranding. 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 GI AND BOWEL: The stomach, small bowel, and colon are not abnormally distended. No wall thickening or inflammatory infiltration is demonstrated. Scattered colonic diverticula without evidence of acute diverticulitis. The appendix is normal. There is a posterior perianal collection with an air-fluid level measuring 3.4 cm diameter. This appears to be posterior to the anorectal junction and likely represents a perianal abscess. Posterior rectocele would be less likely. PERITONEUM AND RETROPERITONEUM: No ascites. No free air. VASCULATURE: Aorta is normal in caliber. Calcification of the aorta. LYMPH NODES: No lymphadenopathy. 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 BONES AND SOFT TISSUES: Degenerative changes in the spine. 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 No acute osseous abnormality. IMPRESSION: 1. Posterior perianal collection with an air-fluid level measuring 3.4 cm, most consistent with a perianal abscess. Posterior rectocele is less likely. 2. Scattered colonic diverticula without evidence of acute diverticulitis. Electronically signed by: Elsie Gravely MD 04/11/2024 04:52 PM EST RP Workstation: [id number]    Anti-infectives: Anti-infectives (From admission, onward)    Start     Dose/Rate Route Frequency Ordered Stop   04/12/24 0400  piperacillin -tazobactam (ZOSYN ) IVPB 3.375 g  3.375 g 12.5 mL/hr over 240  Minutes Intravenous Every 8 hours 04/11/24 2102     04/11/24 2030  piperacillin -tazobactam (ZOSYN ) IVPB 3.375 g        3.375 g 100 mL/hr over 30 Minutes Intravenous  Once 04/11/24 2026 04/11/24 2148       Assessment/Plan: Perianal abscess -plan for eua and abscess drainage this am as discussed with patient  Donnice Bury 04/12/2024

## 2024-04-12 NOTE — Transfer of Care (Signed)
 Immediate Anesthesia Transfer of Care Note  Patient: Joseph Navarro  Procedure(s) Performed: ERASMO UNDER ANESTHESIA, PERIANAL ABSCESS (Rectum) INCISION AND DRAINAGE, ABSCESS, PERIANAL  Patient Location: PACU  Anesthesia Type:General  Level of Consciousness: drowsy and patient cooperative  Airway & Oxygen Therapy: Patient Spontanous Breathing and Patient connected to nasal cannula oxygen  Post-op Assessment: Report given to RN and Post -op Vital signs reviewed and stable  Post vital signs: Reviewed and stable  Last Vitals:  Vitals Value Taken Time  BP 114/68 04/12/24 11:55  Temp    Pulse 59 04/12/24 11:58  Resp 13 04/12/24 11:58  SpO2 100 % 04/12/24 11:58  Vitals shown include unfiled device data.  Last Pain:  Vitals:   04/12/24 0900  TempSrc:   PainSc: Asleep         Complications: No notable events documented.

## 2024-04-12 NOTE — Progress Notes (Signed)
 Pt arrived to unit via stretcher with ED staff at side. Pt is warm, dry, no visible distress.

## 2024-04-12 NOTE — Op Note (Signed)
 Preoperative diagnosis: Perianal air-fluid collection draining stool Postoperative diagnosis: Perianal transphincteric fistula Procedure Exam under anesthesia Anoscopy Drainage of posterior perianal abscess Placement of seton Surgeon: Dr. Adina Bury Anesthesia: General Estimated blood loss: Minimal Specimens: Cultures to microbiology Complications: None Drains: None Sponge needle count was correct completion Disposition recovery stable condition  Indications: This is a 68 year old male who has no prior history of perianal abscess.  He has been to the emergency room several times with perianal pain.  The last time he was underwent a CT scan which showed a  perianal collection with an air-fluid level measuring about 3.4 cm.  He was evaluated and it was difficult to examine him due to pain.  We discussed going to the operating room for an exam under anesthesia.  Procedure: After informed consent was obtained he was taken to the operating room.  He was given antibiotics.  SCDs were in place.  He was placed under general anesthesia without complication.  He was placed in the lithotomy position and appropriately padded.  He was then prepped and draped in a standard sterile surgical fashion.  A surgical timeout was then performed.  I evaluated him under anesthesia.  He had a fairly large posterior collection that initially was thought to be an external hemorrhoid but this was actually draining stool and air.  This already had a hole in it.  I did an anoscopy and identified a fair amount of stool which I evacuated.  There was a fair amount of stool present inside the posterior collection.  On anoscopy above the sphincter there is a linear tear in the rectal mucosa that connected posterior to the sphincters to the large collection.  I adequately drained this.  And irrigated it.  I then elected to place a seton from a vessel loop around this.  He tolerated this well was extubated and transferred to  recovery stable.  He will need colorectal surgery follow-up for the long-term.

## 2024-04-13 ENCOUNTER — Encounter (HOSPITAL_COMMUNITY): Payer: Self-pay | Admitting: General Surgery

## 2024-04-13 DIAGNOSIS — K644 Residual hemorrhoidal skin tags: Secondary | ICD-10-CM | POA: Diagnosis not present

## 2024-04-13 DIAGNOSIS — E119 Type 2 diabetes mellitus without complications: Secondary | ICD-10-CM | POA: Diagnosis not present

## 2024-04-13 DIAGNOSIS — K610 Anal abscess: Secondary | ICD-10-CM | POA: Diagnosis not present

## 2024-04-13 DIAGNOSIS — I10 Essential (primary) hypertension: Secondary | ICD-10-CM | POA: Diagnosis not present

## 2024-04-13 LAB — CBC WITH DIFFERENTIAL/PLATELET
Abs Immature Granulocytes: 0.05 K/uL (ref 0.00–0.07)
Basophils Absolute: 0 K/uL (ref 0.0–0.1)
Basophils Relative: 0 %
Eosinophils Absolute: 0 K/uL (ref 0.0–0.5)
Eosinophils Relative: 0 %
HCT: 38.7 % — ABNORMAL LOW (ref 39.0–52.0)
Hemoglobin: 12.5 g/dL — ABNORMAL LOW (ref 13.0–17.0)
Immature Granulocytes: 1 %
Lymphocytes Relative: 14 %
Lymphs Abs: 1.5 K/uL (ref 0.7–4.0)
MCH: 30.7 pg (ref 26.0–34.0)
MCHC: 32.3 g/dL (ref 30.0–36.0)
MCV: 95.1 fL (ref 80.0–100.0)
Monocytes Absolute: 0.9 K/uL (ref 0.1–1.0)
Monocytes Relative: 8 %
Neutro Abs: 8.3 K/uL — ABNORMAL HIGH (ref 1.7–7.7)
Neutrophils Relative %: 77 %
Platelets: 345 K/uL (ref 150–400)
RBC: 4.07 MIL/uL — ABNORMAL LOW (ref 4.22–5.81)
RDW: 12.7 % (ref 11.5–15.5)
WBC: 10.7 K/uL — ABNORMAL HIGH (ref 4.0–10.5)
nRBC: 0 % (ref 0.0–0.2)

## 2024-04-13 LAB — MAGNESIUM: Magnesium: 2.5 mg/dL — ABNORMAL HIGH (ref 1.7–2.4)

## 2024-04-13 LAB — GLUCOSE, CAPILLARY
Glucose-Capillary: 138 mg/dL — ABNORMAL HIGH (ref 70–99)
Glucose-Capillary: 162 mg/dL — ABNORMAL HIGH (ref 70–99)
Glucose-Capillary: 167 mg/dL — ABNORMAL HIGH (ref 70–99)
Glucose-Capillary: 204 mg/dL — ABNORMAL HIGH (ref 70–99)
Glucose-Capillary: 217 mg/dL — ABNORMAL HIGH (ref 70–99)
Glucose-Capillary: 230 mg/dL — ABNORMAL HIGH (ref 70–99)

## 2024-04-13 LAB — BASIC METABOLIC PANEL WITH GFR
Anion gap: 7 (ref 5–15)
BUN: 14 mg/dL (ref 8–23)
CO2: 27 mmol/L (ref 22–32)
Calcium: 9 mg/dL (ref 8.9–10.3)
Chloride: 101 mmol/L (ref 98–111)
Creatinine, Ser: 0.95 mg/dL (ref 0.61–1.24)
GFR, Estimated: >60 mL/min (ref >60)
Glucose, Bld: 155 mg/dL — ABNORMAL HIGH (ref 70–99)
Potassium: 4.4 mmol/L (ref 3.5–5.1)
Sodium: 136 mmol/L (ref 135–145)

## 2024-04-13 NOTE — Progress Notes (Signed)
 [facility]  PROCEDURAL EXPEDITER PROGRESS NOTE  Patient Name: Joseph Navarro  DOB:July 21, 1955 Date of Admission: 04/11/2024  Date of Assessment:04/13/24   -------------------------------------------------------------------------------------------------------------------  Continue with medical work up  -------------------------------------------------------------------------------------------------------------------  [employer], Ronal DELENA Bald Please contact us  directly via secure chat (search for [facility]) or by calling us  at [phone] [facility]).

## 2024-04-13 NOTE — Plan of Care (Signed)
  Problem: Nutritional: Goal: Maintenance of adequate nutrition will improve Outcome: Progressing   Problem: Skin Integrity: Goal: Risk for impaired skin integrity will decrease Outcome: Progressing   Problem: Tissue Perfusion: Goal: Adequacy of tissue perfusion will improve Outcome: Progressing   Problem: Education: Goal: Knowledge of General Education information will improve Description: Including pain rating scale, medication(s)/side effects and non-pharmacologic comfort measures Outcome: Progressing

## 2024-04-13 NOTE — Progress Notes (Signed)
 Progress Note  1 Day Post-Op  Subjective: Patient reports that pain is manageable and improved. Has not had BM since exam under anesthesia. Having flatulence. Patient tolerating carb modified diet. Denies nausea and vomiting.   ROS  All negative with the exception of above.  Objective: Vital signs in last 24 hours: Temp:  [97.3 F (36.3 C)-99 F (37.2 C)] 97.8 F (36.6 C) (11/24 0503) Pulse Rate:  [53-82] 53 (11/24 0503) Resp:  [11-18] 18 (11/24 0503) BP: (114-180)/(68-99) 166/82 (11/24 0503) SpO2:  [91 %-100 %] 96 % (11/24 0503) Last BM Date : 04/13/24  Intake/Output from previous day: 11/23 0701 - 11/24 0700 In: 2654.7 [P.O.:1200; I.V.:1118.8; IV Piggyback:335.9] Out: 1300 [Urine:1300] Intake/Output this shift: No intake/output data recorded.  PE: Exam completed with nursing staff present.  General: Pleasant male who is laying in bed in NAD. HEENT: Head is normocephalic, atraumatic.   Lungs: Respiratory effort nonlabored. Abd: Soft, NT, ND. No rebound tenderness or guarding. MS: Able to move all extremities.  Skin: Warm and dry.  Psych: A&Ox3 with an appropriate affect.  GU: External hemorrhoid noted. Seton present. No stool, active bleeding, or drainage noted.   Lab Results:  Recent Labs    04/12/24 0529 04/13/24 0432  WBC 11.2* 10.7*  HGB 12.5* 12.5*  HCT 37.3* 38.7*  PLT 318 345   BMET Recent Labs    04/12/24 0529 04/13/24 0432  NA 137 136  K 4.1 4.4  CL 101 101  CO2 27 27  GLUCOSE 152* 155*  BUN 8 14  CREATININE 0.92 0.95  CALCIUM 9.3 9.0   PT/INR No results for input(s): LABPROT, INR in the last 72 hours. CMP     Component Value Date/Time   NA 136 04/13/2024 0432   K 4.4 04/13/2024 0432   CL 101 04/13/2024 0432   CO2 27 04/13/2024 0432   GLUCOSE 155 (H) 04/13/2024 0432   BUN 14 04/13/2024 0432   CREATININE 0.95 04/13/2024 0432   CALCIUM 9.0 04/13/2024 0432   PROT 7.3 04/12/2024 0529   ALBUMIN 3.6 04/12/2024 0529   AST 14 (L)  04/12/2024 0529   ALT 18 04/12/2024 0529   ALKPHOS 71 04/12/2024 0529   BILITOT 0.4 04/12/2024 0529   GFRNONAA >60 04/13/2024 0432   GFRAA 86 (L) 07/09/2012 1710   Lipase  No results found for: LIPASE     Studies/Results: CT ABDOMEN PELVIS W CONTRAST Result Date: 04/11/2024 EXAM: CT ABDOMEN AND PELVIS WITH CONTRAST 04/11/2024 04:44:59 PM TECHNIQUE: CT of the abdomen and pelvis was performed with the administration of 100 mL of iohexol  (OMNIPAQUE ) 300 MG/ML solution. Multiplanar reformatted images are provided for review. Automated exposure control, iterative reconstruction, and/or weight-based adjustment of the mA/kV was utilized to reduce the radiation dose to as low as reasonably achievable. COMPARISON: None available. CLINICAL HISTORY: Abdominal pain, acute, nonlocalized; Rectal pain. FINDINGS: LOWER CHEST: Prominent atelectasis in the lung bases. Motion artifact limits examination. LIVER: The liver is unremarkable. GALLBLADDER AND BILE DUCTS: Gallbladder is unremarkable. No biliary ductal dilatation. SPLEEN: No acute abnormality. PANCREAS: No acute abnormality. ADRENAL GLANDS: No acute abnormality. KIDNEYS, URETERS AND BLADDER: Bilateral renal cysts, largest measuring up to 3.5 cm diameter. No imaging follow-up is indicated. No stones in the kidneys or ureters. No hydronephrosis. No perinephric or periureteral stranding. 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 GI AND BOWEL: The stomach, small bowel, and colon are not abnormally distended. No wall thickening or inflammatory infiltration  is demonstrated. Scattered colonic diverticula without evidence of acute diverticulitis. The appendix is normal. There is a posterior perianal collection with an air-fluid level measuring 3.4 cm diameter. This appears to be posterior to the anorectal junction and likely represents a perianal abscess. Posterior rectocele would be less  likely. PERITONEUM AND RETROPERITONEUM: No ascites. No free air. VASCULATURE: Aorta is normal in caliber. Calcification of the aorta. LYMPH NODES: No lymphadenopathy. 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 BONES AND SOFT TISSUES: Degenerative changes in the spine. 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 No acute osseous abnormality. IMPRESSION: 1. Posterior perianal collection with an air-fluid level measuring 3.4 cm, most consistent with a perianal abscess. Posterior rectocele is less likely. 2. Scattered colonic diverticula without evidence of acute diverticulitis. Electronically signed by: Elsie Gravely MD 04/11/2024 04:52 PM EST RP Workstation: [id number]    Anti-infectives: Anti-infectives (From admission, onward)    Start     Dose/Rate Route Frequency Ordered Stop   04/12/24 1102  piperacillin -tazobactam (ZOSYN ) 3.375 GM/50ML IVPB       Note to Pharmacy: Pheobe Cain L: cabinet override      04/12/24 1102 04/12/24 1133   04/12/24 0400  piperacillin -tazobactam (ZOSYN ) IVPB 3.375 g        3.375 g 12.5 mL/hr over 240 Minutes Intravenous Every 8 hours 04/11/24 2102     04/11/24 2030  piperacillin -tazobactam (ZOSYN ) IVPB 3.375 g        3.375 g 100 mL/hr over 30 Minutes Intravenous  Once 04/11/24 2026 04/11/24 2148        Assessment/Plan POD1: S/P the following on 11/23 by Dr. Freddrick with findings of perianal transphincteric fistula: Exam under anesthesia Anoscopy Drainage of posterior perianal abscess Placement of seton -Afebrile. -WBC 10.7 from 11.2; HGB 12.5 from 12.5 -Exam without significant concerns. Patient feels improved overall. -Continue antibiotics. -Will continue to follow. -Planning for outpatient follow up with colorectal specialists.   FEN: Carb modified; IVF per primary team VTE: SCDs ID: [email or web address]      LOS: 2 days   I reviewed hosptialist notes, specialist notes, operative notes, nursing notes, last 24 h vitals and pain scores, last 48 h intake and output, last 24 h labs and trends, and last 24 h imaging results.   Marjorie Carlyon Favre, [facility] Surgery 04/13/2024, 10:20 AM Please see Amion for pager number during day hours 7:00am-4:30pm

## 2024-04-13 NOTE — Plan of Care (Signed)
   Problem: Coping: Goal: Ability to adjust to condition or change in health will improve Outcome: Progressing   Problem: Fluid Volume: Goal: Ability to maintain a balanced intake and output will improve Outcome: Progressing   Problem: Health Behavior/Discharge Planning: Goal: Ability to identify and utilize available resources and services will improve Outcome: Progressing

## 2024-04-13 NOTE — TOC Initial Note (Signed)
 Transition of Care [facility]) - Initial/Assessment Note    Patient Details  Name: Joseph Navarro MRN: [id number] Date of Birth: 07/11/1955  [facility] CM/SW Contact:    Alfonse JONELLE Rex, RN Phone Number: 04/13/2024, 10:26 AM  Clinical Narrative:   Patient presented to ED with c/o perineal pain on 04/11/24. Patient has health insurance and PCP on file, resides in an apartment with family contact on file. INPT CM will follow for dc needs.                Expected Discharge Plan: Home/Self Care Barriers to Discharge: Continued Medical Work up   Patient Goals and CMS Choice Patient states their goals for this hospitalization and ongoing recovery are:: return home          Expected Discharge Plan and Services       Living arrangements for the past 2 months: Apartment                                      Prior Living Arrangements/Services Living arrangements for the past 2 months: Apartment   Patient language and need for interpreter reviewed:: Yes        Need for Family Participation in Patient Care: Yes (Comment) Care giver support system in place?: Yes (comment)   Criminal Activity/Legal Involvement Pertinent to Current Situation/Hospitalization: No - Comment as needed  Activities of Daily Living   ADL Screening (condition at time of admission) Independently performs ADLs?: Yes (appropriate for developmental age) Is the patient deaf or have difficulty hearing?: No Does the patient have difficulty seeing, even when wearing glasses/contacts?: No Does the patient have difficulty concentrating, remembering, or making decisions?: No  Permission Sought/Granted                  Emotional Assessment       Orientation: : Oriented to Self, Oriented to Place, Oriented to  Time, Oriented to Situation Alcohol / Substance Use: Not Applicable Psych Involvement: No (comment)  Admission diagnosis:  Perianal abscess [K61.0] External hemorrhoid  [K64.4] Perianal pain [K62.89] Patient Active Problem List   Diagnosis Date Noted   Perianal abscess 04/11/2024   External hemorrhoid 04/11/2024   Essential hypertension 10/01/2019   Tinea pedis of both feet 10/01/2019   Hammertoe, bilateral 10/01/2019   Tendinitis of left rotator cuff 07/24/2019   Arthritis of right hip 01/29/2019   Arthritis of left hip 09/12/2018   Chronic pain of both hips 08/06/2018   Diabetes mellitus type II, controlled (HCC) 08/06/2018   PCP:  Tammy Tari DASEN PA-C Pharmacy:   [facility] [address] [place] - [address] [place] [address] Phone: [phone] Fax: [phone]     Social Drivers of Health (SDOH) Social History: SDOH Screenings   Food Insecurity: No Food Insecurity (04/11/2024)  Housing: High Risk (04/11/2024)  Transportation Needs: No Transportation Needs (04/11/2024)  Utilities: Not At Risk (04/11/2024)  Social Connections: Unknown (04/12/2024)  Tobacco Use: Low Risk  (04/12/2024)   SDOH Interventions:     Readmission Risk Interventions    04/13/2024   10:23 AM  Readmission Risk Prevention Plan  Post Dischage Appt Complete  Medication Screening Complete  Transportation Screening Complete

## 2024-04-13 NOTE — Progress Notes (Signed)
 PROGRESS NOTE    Joseph Navarro  FMW:[id number] DOB: [birth year] DOA: 04/11/2024 PCP: Tammy Tari DASEN, PA-C    No chief complaint on file.   Brief Narrative:  Patient 68 year old gentleman history of type 2 diabetes, hypertension presented to the ED with rectal pain x 3 days.  Patient seen in the ED 1 day prior to admission for rectal pain diagnosed with nonthrombosed external hemorrhoids prescribed antibiotics and discharged home however presented back with no significant improvement.  CT abdomen and pelvis done with concerns for perianal abscess.  General surgery consulted.  Patient status post anoscopy and drainage of posterior perianal abscess on 04/12/2024.     Assessment & Plan:   Principal Problem:   Perianal abscess Active Problems:   Diabetes mellitus type II, controlled (HCC)   Essential hypertension   External hemorrhoid  #1 perianal abscess/posterior external hemorrhoid -Patient presented with rectal pain x 3 days. - CT abdomen and pelvis done concerning for perianal abscess. - Patient seen in consultation by general surgery who feel patient may have a spontaneously draining perianal abscess out through external hemorrhoid which has not been adequately evacuated. - General Surgery recommended incision and drainage under anesthesia and possible excisional hemorrhoidectomy. - Patient status post anoscopy, drainage of posterior perianal abscess, placement of seton per general surgery, Dr. Ebbie 04/12/2024.   - Cultures pending.   - Continue empiric IV Zosyn .   - Pain management.   - Wound care changes as per general surgery. - Patient will need to follow-up with colorectal specialist in the outpatient setting per general surgery - Per general surgery.   2.  Diabetes mellitus type 2 -Hemoglobin A1c 8.6. - CBG noted at 217 this morning. - Patient noted on metformin prior to admission. - Continue to hold oral hypoglycemic agents. - SSI. - If CBGs remain  persistently elevated may need to start on long-acting insulin .  3.  Hypertension - Continue Cozaar .      DVT prophylaxis: SCDs Code Status: Full Family Communication: Updated patient.  No family at bedside. Disposition: Home when clinically improved and cleared by general surgery.  Status is: Inpatient Remains inpatient appropriate because: Severity of illness   Consultants:  General Surgery: Dr. Tanda 04/11/2024   Procedures:  CT abdomen and pelvis 04/11/2024 Anoscopy/drainage of posterior perianal abscess/placement of seton per general surgery: Dr. Ebbie 04/12/2024  Antimicrobials:  Anti-infectives (From admission, onward)    Start     Dose/Rate Route Frequency Ordered Stop   04/12/24 1102  piperacillin -tazobactam (ZOSYN ) 3.375 GM/50ML IVPB       Note to Pharmacy: Pheobe Cain L: cabinet override      04/12/24 1102 04/12/24 1133   04/12/24 0400  piperacillin -tazobactam (ZOSYN ) IVPB 3.375 g        3.375 g 12.5 mL/hr over 240 Minutes Intravenous Every 8 hours 04/11/24 2102     04/11/24 2030  piperacillin -tazobactam (ZOSYN ) IVPB 3.375 g        3.375 g 100 mL/hr over 30 Minutes Intravenous  Once 04/11/24 2026 04/11/24 2148         Subjective: Patient laying in bed.  States rectal pain has improved since admission.  Denies any chest pain or shortness of breath.  No abdominal pain.   Objective: Vitals:   04/12/24 2017 04/13/24 0140 04/13/24 0503 04/13/24 1326  BP: 133/80 (!) 157/84 (!) 166/82 139/71  Pulse: 70 (!) 57 (!) 53 (!) 57  Resp: 18 18 18 17   Temp: 98.4 F (36.9 C) 97.9 F (36.6 C) 97.8  F (36.6 C) 98 F (36.7 C)  TempSrc: Oral Oral Oral Oral  SpO2: 97% 99% 96% 98%  Weight:      Height:        Intake/Output Summary (Last 24 hours) at 04/13/2024 1635 Last data filed at 04/13/2024 1634 Gross per 24 hour  Intake 2299.54 ml  Output 1700 ml  Net 599.54 ml   Filed Weights   04/11/24 2223  Weight: 88.5 kg    Examination:  General  exam: NAD. Respiratory system: CTAB.  No wheezes, no crackles, no rhonchi.  Fair air movement.  Speaking in full sentences.  Cardiovascular system: Regular rate rhythm no murmurs rubs or gallops.  No JVD.  No lower extremity edema.  Gastrointestinal system: Abdomen is soft, nontender, nondistended, positive bowel sounds.  No rebound.  No guarding. Central nervous system: Alert and oriented. No focal neurological deficits. Extremities: Symmetric 5 x 5 power. Skin: No rashes, lesions or ulcers Psychiatry: Judgement and insight appear normal. Mood & affect appropriate.     Data Reviewed: I have personally reviewed following labs and imaging studies  CBC: Recent Labs  Lab 04/11/24 0302 04/12/24 0529 04/13/24 0432  WBC 10.1 11.2* 10.7*  NEUTROABS  --   --  8.3*  HGB 13.4 12.5* 12.5*  HCT 40.9 37.3* 38.7*  MCV 92.7 93.0 95.1  PLT 351 318 345    Basic Metabolic Panel: Recent Labs  Lab 04/11/24 0302 04/12/24 0529 04/13/24 0432  NA 136 137 136  K 3.7 4.1 4.4  CL 100 101 101  CO2 25 27 27   GLUCOSE 166* 152* 155*  BUN 9 8 14   CREATININE 0.99 0.92 0.95  CALCIUM 9.7 9.3 9.0  MG  --  2.2 2.5*  PHOS  --  2.8  --     GFR: Estimated Creatinine Clearance: 84.2 mL/min (by C-G formula based on SCr of 0.95 mg/dL).  Liver Function Tests: Recent Labs  Lab 04/11/24 0302 04/12/24 0529  AST 21 14*  ALT 24 18  ALKPHOS 66 71  BILITOT 0.5 0.4  PROT 8.0 7.3  ALBUMIN 4.3 3.6    CBG: Recent Labs  Lab 04/12/24 2355 04/13/24 0404 04/13/24 0804 04/13/24 1203 04/13/24 1629  GLUCAP 220* 162* 217* 138* 230*     Recent Results (from the past 240 hours)  Surgical pcr screen     Status: None   Collection Time: 04/12/24  9:18 AM   Specimen: Nasal Mucosa; Nasal Swab  Result Value Ref Range Status   MRSA, PCR NEGATIVE NEGATIVE Final   Staphylococcus aureus NEGATIVE NEGATIVE Final    Comment: (NOTE) The Xpert SA Assay (FDA approved for NASAL specimens in patients 43 years of age  and older), is one component of a comprehensive surveillance program. It is not intended to diagnose infection nor to guide or monitor treatment. Performed at [facility], [address] [place], [place] [address]   Aerobic/Anaerobic Culture w Gram Stain (surgical/deep wound)     Status: None (Preliminary result)   Collection Time: 04/12/24 11:57 AM   Specimen: Path fluid; Body Fluid  Result Value Ref Range Status   Specimen Description   Final    FLUID Performed at [facility], [address] [place], [place] [address]    Special Requests   Final    NONE Performed at [facility], [address], [place], [place] [address]    Gram Stain   Final    RARE WBC SEEN MODERATE GRAM NEGATIVE  RODS MODERATE GRAM POSITIVE RODS ABUNDANT GRAM POSITIVE COCCI    Culture   Final    MODERATE ESCHERICHIA COLI MODERATE PSEUDOMONAS AERUGINOSA SUBCULTURING FOR SUSCEPTIBILITES Performed at [facility], [address], [place], [place] [address]    Report Status PENDING  Incomplete         Radiology Studies: CT ABDOMEN PELVIS W CONTRAST Result Date: 04/11/2024 EXAM: CT ABDOMEN AND PELVIS WITH CONTRAST 04/11/2024 04:44:59 PM TECHNIQUE: CT of the abdomen and pelvis was performed with the administration of 100 mL of iohexol  (OMNIPAQUE ) 300 MG/ML solution. Multiplanar reformatted images are provided for review. Automated exposure control, iterative reconstruction, and/or weight-based adjustment of the mA/kV was utilized to reduce the radiation dose to as low as reasonably achievable. COMPARISON: None available. CLINICAL HISTORY: Abdominal pain, acute, nonlocalized; Rectal pain. FINDINGS: LOWER CHEST: Prominent atelectasis in the lung bases. Motion artifact limits examination. LIVER: The liver is unremarkable. GALLBLADDER AND BILE DUCTS: Gallbladder is unremarkable. No biliary ductal dilatation. SPLEEN: No acute abnormality. PANCREAS:  No acute abnormality. ADRENAL GLANDS: No acute abnormality. KIDNEYS, URETERS AND BLADDER: Bilateral renal cysts, largest measuring up to 3.5 cm diameter. No imaging follow-up is indicated. No stones in the kidneys or ureters. No hydronephrosis. No perinephric or periureteral stranding. 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 GI AND BOWEL: The stomach, small bowel, and colon are not abnormally distended. No wall thickening or inflammatory infiltration is demonstrated. Scattered colonic diverticula without evidence of acute diverticulitis. The appendix is normal. There is a posterior perianal collection with an air-fluid level measuring 3.4 cm diameter. This appears to be posterior to the anorectal junction and likely represents a perianal abscess. Posterior rectocele would be less likely. PERITONEUM AND RETROPERITONEUM: No ascites. No free air. VASCULATURE: Aorta is normal in caliber. Calcification of the aorta. LYMPH NODES: No lymphadenopathy. 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 BONES AND SOFT TISSUES: Degenerative changes in the spine. 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 No acute osseous abnormality. IMPRESSION: 1. Posterior perianal collection with an air-fluid level measuring 3.4 cm, most consistent with a perianal abscess. Posterior rectocele is less likely. 2. Scattered colonic diverticula without evidence of acute diverticulitis. Electronically signed by: Elsie Gravely MD 04/11/2024 04:52 PM EST RP Workstation: [id number]        Scheduled Meds:  insulin  aspart  0-9 Units Subcutaneous Q4H   losartan   50 mg Oral Daily   senna-docusate  1 tablet Oral BID   Continuous Infusions:  piperacillin -tazobactam (ZOSYN )  IV 3.375 g (04/13/24 1135)     LOS: 2 days    Time  spent: 40 minutes    Toribio Hummer, MD [facility]   To contact the attending provider between 7A-7P or the covering provider during after hours 7P-7A, please log into the web site [email or web address] and access using universal [place] password for that web site. If you do not have the password, please call the hospital operator.  04/13/2024, 4:35 PM

## 2024-04-14 DIAGNOSIS — K610 Anal abscess: Secondary | ICD-10-CM | POA: Diagnosis not present

## 2024-04-14 DIAGNOSIS — E119 Type 2 diabetes mellitus without complications: Secondary | ICD-10-CM | POA: Diagnosis not present

## 2024-04-14 DIAGNOSIS — K644 Residual hemorrhoidal skin tags: Secondary | ICD-10-CM | POA: Diagnosis not present

## 2024-04-14 DIAGNOSIS — I10 Essential (primary) hypertension: Secondary | ICD-10-CM | POA: Diagnosis not present

## 2024-04-14 LAB — CBC WITH DIFFERENTIAL/PLATELET
Abs Immature Granulocytes: 0.03 K/uL (ref 0.00–0.07)
Basophils Absolute: 0 K/uL (ref 0.0–0.1)
Basophils Relative: 0 %
Eosinophils Absolute: 0.1 K/uL (ref 0.0–0.5)
Eosinophils Relative: 2 %
HCT: 37.7 % — ABNORMAL LOW (ref 39.0–52.0)
Hemoglobin: 12.4 g/dL — ABNORMAL LOW (ref 13.0–17.0)
Immature Granulocytes: 0 %
Lymphocytes Relative: 33 %
Lymphs Abs: 2.4 K/uL (ref 0.7–4.0)
MCH: 31 pg (ref 26.0–34.0)
MCHC: 32.9 g/dL (ref 30.0–36.0)
MCV: 94.3 fL (ref 80.0–100.0)
Monocytes Absolute: 0.6 K/uL (ref 0.1–1.0)
Monocytes Relative: 9 %
Neutro Abs: 3.9 K/uL (ref 1.7–7.7)
Neutrophils Relative %: 56 %
Platelets: 373 K/uL (ref 150–400)
RBC: 4 MIL/uL — ABNORMAL LOW (ref 4.22–5.81)
RDW: 12.6 % (ref 11.5–15.5)
WBC: 7.1 K/uL (ref 4.0–10.5)
nRBC: 0 % (ref 0.0–0.2)

## 2024-04-14 LAB — BASIC METABOLIC PANEL WITH GFR
Anion gap: 9 (ref 5–15)
BUN: 12 mg/dL (ref 8–23)
CO2: 28 mmol/L (ref 22–32)
Calcium: 8.9 mg/dL (ref 8.9–10.3)
Chloride: 102 mmol/L (ref 98–111)
Creatinine, Ser: 0.89 mg/dL (ref 0.61–1.24)
GFR, Estimated: >60 mL/min (ref >60)
Glucose, Bld: 132 mg/dL — ABNORMAL HIGH (ref 70–99)
Potassium: 4.1 mmol/L (ref 3.5–5.1)
Sodium: 138 mmol/L (ref 135–145)

## 2024-04-14 LAB — GLUCOSE, CAPILLARY
Glucose-Capillary: 134 mg/dL — ABNORMAL HIGH (ref 70–99)
Glucose-Capillary: 129 mg/dL — ABNORMAL HIGH (ref 70–99)
Glucose-Capillary: 140 mg/dL — ABNORMAL HIGH (ref 70–99)
Glucose-Capillary: 215 mg/dL — ABNORMAL HIGH (ref 70–99)
Glucose-Capillary: 207 mg/dL — ABNORMAL HIGH (ref 70–99)

## 2024-04-14 NOTE — Progress Notes (Signed)
 Progress Note  2 Days Post-Op  Subjective: Reports that he feels good this morning. Patient reports that pain is manageable. Had BM this morning and flatulence. Tolerating carb modified diet without nausea and vomiting.   ROS  All negative with the exception of above.  Objective: Vital signs in last 24 hours: Temp:  [97.5 F (36.4 C)-98.1 F (36.7 C)] 97.5 F (36.4 C) (11/25 0928) Pulse Rate:  [53-57] 53 (11/25 0928) Resp:  [15-17] 15 (11/25 0928) BP: (139-168)/(71-88) 168/83 (11/25 0928) SpO2:  [96 %-98 %] 97 % (11/25 0928) Last BM Date : 04/13/24  Intake/Output from previous day: 11/24 0701 - 11/25 0700 In: 1049.3 [P.O.:780; I.V.:106.7; IV Piggyback:162.6] Out: 800 [Urine:800] Intake/Output this shift: Total I/O In: 360 [P.O.:360] Out: -   PE: General: Pleasant male who is laying in bed in NAD. HEENT: Head is normocephalic, atraumatic.   Lungs: Respiratory effort nonlabored. Abd: Soft, NT, ND. No rebound tenderness or guarding. MS: Able to move all extremities.  Skin: Warm and dry.  Psych: A&Ox3 with an appropriate affect.  GU: Patient reports that dressing was recently changed. Deferred exam.   Lab Results:  Recent Labs    04/13/24 0432 04/14/24 0445  WBC 10.7* 7.1  HGB 12.5* 12.4*  HCT 38.7* 37.7*  PLT 345 373   BMET Recent Labs    04/13/24 0432 04/14/24 0445  NA 136 138  K 4.4 4.1  CL 101 102  CO2 27 28  GLUCOSE 155* 132*  BUN 14 12  CREATININE 0.95 0.89  CALCIUM 9.0 8.9   PT/INR No results for input(s): LABPROT, INR in the last 72 hours. CMP     Component Value Date/Time   NA 138 04/14/2024 0445   K 4.1 04/14/2024 0445   CL 102 04/14/2024 0445   CO2 28 04/14/2024 0445   GLUCOSE 132 (H) 04/14/2024 0445   BUN 12 04/14/2024 0445   CREATININE 0.89 04/14/2024 0445   CALCIUM 8.9 04/14/2024 0445   PROT 7.3 04/12/2024 0529   ALBUMIN 3.6 04/12/2024 0529   AST 14 (L) 04/12/2024 0529   ALT 18 04/12/2024 0529   ALKPHOS 71 04/12/2024  0529   BILITOT 0.4 04/12/2024 0529   GFRNONAA >60 04/14/2024 0445   GFRAA 86 (L) 07/09/2012 1710   Lipase  No results found for: LIPASE     Studies/Results: No results found.  Anti-infectives: Anti-infectives (From admission, onward)    Start     Dose/Rate Route Frequency Ordered Stop   04/12/24 1102  piperacillin -tazobactam (ZOSYN ) 3.375 GM/50ML IVPB       Note to Pharmacy: Pheobe Cain L: cabinet override      04/12/24 1102 04/12/24 1133   04/12/24 0400  piperacillin -tazobactam (ZOSYN ) IVPB 3.375 g        3.375 g 12.5 mL/hr over 240 Minutes Intravenous Every 8 hours 04/11/24 2102     04/11/24 2030  piperacillin -tazobactam (ZOSYN ) IVPB 3.375 g        3.375 g 100 mL/hr over 30 Minutes Intravenous  Once 04/11/24 2026 04/11/24 2148        Assessment/Plan POD2: S/P the following on 11/23 by Dr. Freddrick with findings of perianal transphincteric fistula: Exam under anesthesia Anoscopy Drainage of posterior perianal abscess Placement of seton -Afebrile. -WBC 7.1; HGB 12.4 from 12.5 -Clinically patient improved overall.  -Continue antibiotics while hospitalized. Does not need antibiotics at discharge. -Stable from general surgery standpoint. We will sign off. Outpatient follow up with colorectal specialists has been arranged. Please call for further  questions and concerns.    FEN: Carb modified; IVF per primary team VTE: SCDs ID: [email or web address]  currently.    LOS: 3 days   I reviewed nursing notes, specialist notes, hospitalist notes, last 24 h vitals and pain scores, last 48 h intake and output, last 24 h labs and trends, and last 24 h imaging results.   Marjorie Carlyon Favre, [facility] Surgery 04/14/2024, 10:19 AM Please see Amion for pager number during day hours 7:00am-4:30pm

## 2024-04-14 NOTE — Progress Notes (Signed)
 PROGRESS NOTE    Joseph Navarro  FMW:[id number] DOB: 06-28-1955 DOA: 04/11/2024 PCP: Tammy Tari DASEN, PA-C    No chief complaint on file.   Brief Narrative:  Patient 68 year old gentleman history of type 2 diabetes, hypertension presented to the ED with rectal pain x 3 days.  Patient seen in the ED 1 day prior to admission for rectal pain diagnosed with nonthrombosed external hemorrhoids prescribed antibiotics and discharged home however presented back with no significant improvement.  CT abdomen and pelvis done with concerns for perianal abscess.  General surgery consulted.  Patient status post anoscopy and drainage of posterior perianal abscess on 04/12/2024.     Assessment & Plan:   Principal Problem:   Perianal abscess Active Problems:   Diabetes mellitus type II, controlled (HCC)   Essential hypertension   External hemorrhoid  #1 perianal abscess/posterior external hemorrhoid -Patient presented with rectal pain x 3 days. - CT abdomen and pelvis done concerning for perianal abscess. - Patient seen in consultation by general surgery who feel patient may have a spontaneously draining perianal abscess out through external hemorrhoid which has not been adequately evacuated. - General Surgery recommended incision and drainage under anesthesia and possible excisional hemorrhoidectomy. - Patient status post anoscopy, drainage of posterior perianal abscess, placement of seton per general surgery, Dr. Ebbie 04/12/2024.   - Preliminary cultures with moderate E. coli, few Pseudomonas aeruginosa.   - Called and spoke with micro bench state another bug growing and culture results should finalize hopefully in the next 24 hours.    - Continue empiric IV Zosyn .  - Once cultures are finalized/resulted we will need to discuss with ID on antibiotic recommendations and durations on discharge. - Pain management.   - Wound care changes as per general surgery. - Patient will need to  follow-up with colorectal specialist in the outpatient setting per general surgery - Per general surgery.   2.  Diabetes mellitus type 2 -Hemoglobin A1c 8.6. - CBG noted at 134 this morning. - Patient noted on metformin prior to admission. - Continue to hold oral hypoglycemic agents.   - Continue SSI.   3.  Hypertension - Cozaar .     DVT prophylaxis: SCDs Code Status: Full Family Communication: Updated patient.  No family at bedside. Disposition: Home when clinically improved, cultures finalized, and cleared by general surgery.  Status is: Inpatient Remains inpatient appropriate because: Severity of illness   Consultants:  General Surgery: Dr. Tanda 04/11/2024   Procedures:  CT abdomen and pelvis 04/11/2024 Anoscopy/drainage of posterior perianal abscess/placement of seton per general surgery: Dr. Ebbie 04/12/2024  Antimicrobials:  Anti-infectives (From admission, onward)    Start     Dose/Rate Route Frequency Ordered Stop   04/12/24 1102  piperacillin -tazobactam (ZOSYN ) 3.375 GM/50ML IVPB       Note to Pharmacy: Pheobe Cain L: cabinet override      04/12/24 1102 04/12/24 1133   04/12/24 0400  piperacillin -tazobactam (ZOSYN ) IVPB 3.375 g        3.375 g 12.5 mL/hr over 240 Minutes Intravenous Every 8 hours 04/11/24 2102     04/11/24 2030  piperacillin -tazobactam (ZOSYN ) IVPB 3.375 g        3.375 g 100 mL/hr over 30 Minutes Intravenous  Once 04/11/24 2026 04/11/24 2148         Subjective: Patient laying in bed.  Overall feeling better.  Rectal pain improving however still with some soreness.  Tolerating current diet.  No chest pain or shortness of breath.  Passing flatus.  Having bowel movement.    Objective: Vitals:   04/13/24 2029 04/14/24 0128 04/14/24 0631 04/14/24 0928  BP: (!) 145/80 (!) 157/86 (!) 168/88 (!) 168/83  Pulse: (!) 57 (!) 56 (!) 55 (!) 53  Resp: 16 16 16 15   Temp: 97.9 F (36.6 C) 98.1 F (36.7 C) 97.8 F (36.6 C) (!) 97.5 F  (36.4 C)  TempSrc: Oral Oral Oral Oral  SpO2: 96% 97% 98% 97%  Weight:      Height:        Intake/Output Summary (Last 24 hours) at 04/14/2024 1032 Last data filed at 04/14/2024 0848 Gross per 24 hour  Intake 1012.57 ml  Output 800 ml  Net 212.57 ml   Filed Weights   04/11/24 2223  Weight: 88.5 kg    Examination:  General exam: NAD Respiratory system: Lungs clear to auscultation bilaterally.  No wheezes, no crackles, no rhonchi.  Fair air movement.  Speaking in full sentences.  Cardiovascular system: RRR no murmurs rubs or gallops.  No JVD.  No lower extremity edema.  Gastrointestinal system: Abdomen is soft, nontender, nondistended, positive bowel sounds.  No rebound.  No guarding.   Central nervous system: Alert and oriented. No focal neurological deficits. Extremities: Symmetric 5 x 5 power. Skin: No rashes, lesions or ulcers Psychiatry: Judgement and insight appear normal. Mood & affect appropriate.     Data Reviewed: I have personally reviewed following labs and imaging studies  CBC: Recent Labs  Lab 04/11/24 0302 04/12/24 0529 04/13/24 0432 04/14/24 0445  WBC 10.1 11.2* 10.7* 7.1  NEUTROABS  --   --  8.3* 3.9  HGB 13.4 12.5* 12.5* 12.4*  HCT 40.9 37.3* 38.7* 37.7*  MCV 92.7 93.0 95.1 94.3  PLT 351 318 345 373    Basic Metabolic Panel: Recent Labs  Lab 04/11/24 0302 04/12/24 0529 04/13/24 0432 04/14/24 0445  NA 136 137 136 138  K 3.7 4.1 4.4 4.1  CL 100 101 101 102  CO2 25 27 27 28   GLUCOSE 166* 152* 155* 132*  BUN 9 8 14 12   CREATININE 0.99 0.92 0.95 0.89  CALCIUM 9.7 9.3 9.0 8.9  MG  --  2.2 2.5*  --   PHOS  --  2.8  --   --     GFR: Estimated Creatinine Clearance: 89.8 mL/min (by C-G formula based on SCr of 0.89 mg/dL).  Liver Function Tests: Recent Labs  Lab 04/11/24 0302 04/12/24 0529  AST 21 14*  ALT 24 18  ALKPHOS 66 71  BILITOT 0.5 0.4  PROT 8.0 7.3  ALBUMIN 4.3 3.6    CBG: Recent Labs  Lab 04/13/24 1629  04/13/24 2027 04/13/24 2352 04/14/24 0345 04/14/24 0826  GLUCAP 230* 204* 167* 134* 129*     Recent Results (from the past 240 hours)  Surgical pcr screen     Status: None   Collection Time: 04/12/24  9:18 AM   Specimen: Nasal Mucosa; Nasal Swab  Result Value Ref Range Status   MRSA, PCR NEGATIVE NEGATIVE Final   Staphylococcus aureus NEGATIVE NEGATIVE Final    Comment: (NOTE) The Xpert SA Assay (FDA approved for NASAL specimens in patients 82 years of age and older), is one component of a comprehensive surveillance program. It is not intended to diagnose infection nor to guide or monitor treatment. Performed at [facility], [address] [place], [place] [address]   Aerobic/Anaerobic Culture w Gram Stain (surgical/deep wound)     Status: None (Preliminary result)  Collection Time: 04/12/24 11:57 AM   Specimen: Path fluid; Body Fluid  Result Value Ref Range Status   Specimen Description   Final    FLUID Performed at [facility], [address] [place], [place] [address]    Special Requests   Final    NONE Performed at [facility], [address] [place], [place] [address]    Gram Stain   Final    RARE WBC SEEN MODERATE GRAM NEGATIVE RODS MODERATE GRAM POSITIVE RODS ABUNDANT GRAM POSITIVE COCCI    Culture   Final    MODERATE ESCHERICHIA COLI IDENTIFICATION AND SUSCEPTIBILITIES TO FOLLOW FEW PSEUDOMONAS AERUGINOSA CULTURE REINCUBATED FOR BETTER GROWTH Performed at [facility], [name] [place], [place] [address]    Report Status PENDING  Incomplete         Radiology Studies: No results found.       Scheduled Meds:  insulin  aspart  0-9 Units Subcutaneous Q4H   losartan   50 mg Oral Daily   senna-docusate  1 tablet Oral BID   Continuous Infusions:  piperacillin -tazobactam (ZOSYN )  IV 3.375 g (04/14/24 0452)     LOS: 3 days    Time spent: 40 minutes    Toribio Hummer, MD [facility]   To contact the attending provider between 7A-7P or the covering provider during after hours 7P-7A, please log into the web site [email or web address] and access using universal [place] password for that web site. If you do not have the password, please call the hospital operator.  04/14/2024, 10:32 AM

## 2024-04-15 ENCOUNTER — Other Ambulatory Visit (HOSPITAL_COMMUNITY): Payer: Self-pay

## 2024-04-15 LAB — CBC WITH DIFFERENTIAL/PLATELET
Abs Immature Granulocytes: 0.04 K/uL (ref 0.00–0.07)
Basophils Absolute: 0 K/uL (ref 0.0–0.1)
Basophils Relative: 1 %
Eosinophils Absolute: 0.1 K/uL (ref 0.0–0.5)
Eosinophils Relative: 2 %
HCT: 38.9 % — ABNORMAL LOW (ref 39.0–52.0)
Hemoglobin: 12.7 g/dL — ABNORMAL LOW (ref 13.0–17.0)
Immature Granulocytes: 1 %
Lymphocytes Relative: 37 %
Lymphs Abs: 2.2 K/uL (ref 0.7–4.0)
MCH: 30.8 pg (ref 26.0–34.0)
MCHC: 32.6 g/dL (ref 30.0–36.0)
MCV: 94.2 fL (ref 80.0–100.0)
Monocytes Absolute: 0.6 K/uL (ref 0.1–1.0)
Monocytes Relative: 11 %
Neutro Abs: 2.9 K/uL (ref 1.7–7.7)
Neutrophils Relative %: 48 %
Platelets: 405 K/uL — ABNORMAL HIGH (ref 150–400)
RBC: 4.13 MIL/uL — ABNORMAL LOW (ref 4.22–5.81)
RDW: 12.6 % (ref 11.5–15.5)
WBC: 5.9 K/uL (ref 4.0–10.5)
nRBC: 0 % (ref 0.0–0.2)

## 2024-04-15 LAB — BASIC METABOLIC PANEL WITH GFR
Anion gap: 9 (ref 5–15)
BUN: 11 mg/dL (ref 8–23)
CO2: 28 mmol/L (ref 22–32)
Calcium: 9.2 mg/dL (ref 8.9–10.3)
Chloride: 101 mmol/L (ref 98–111)
Creatinine, Ser: 0.93 mg/dL (ref 0.61–1.24)
GFR, Estimated: >60 mL/min (ref >60)
Glucose, Bld: 146 mg/dL — ABNORMAL HIGH (ref 70–99)
Potassium: 4.2 mmol/L (ref 3.5–5.1)
Sodium: 138 mmol/L (ref 135–145)

## 2024-04-15 LAB — GLUCOSE, CAPILLARY
Glucose-Capillary: 148 mg/dL — ABNORMAL HIGH (ref 70–99)
Glucose-Capillary: 178 mg/dL — ABNORMAL HIGH (ref 70–99)
Glucose-Capillary: 135 mg/dL — ABNORMAL HIGH (ref 70–99)

## 2024-04-15 MED ORDER — SENNOSIDES-DOCUSATE SODIUM 8.6-50 MG PO TABS
1.0000 | ORAL_TABLET | Freq: Two times a day (BID) | ORAL | 2 refills | Status: AC
  Filled 2024-04-15: qty 60, 30d supply, fill #0

## 2024-04-15 MED ORDER — CIPROFLOXACIN HCL 500 MG PO TABS
500.0000 mg | ORAL_TABLET | Freq: Two times a day (BID) | ORAL | 0 refills | Status: AC
  Filled 2024-04-15: qty 10, 5d supply, fill #0

## 2024-04-15 MED ORDER — HYDROCODONE-ACETAMINOPHEN 5-325 MG PO TABS
1.0000 | ORAL_TABLET | Freq: Four times a day (QID) | ORAL | 0 refills | Status: AC | PRN
  Filled 2024-04-15: qty 20, 5d supply, fill #0

## 2024-04-15 NOTE — Progress Notes (Signed)
 Discharge medications delivered to patient at the bedside.

## 2024-04-15 NOTE — Discharge Summary (Signed)
 Physician Discharge Summary  Joseph Navarro FMW:[id number] DOB: 1956-05-10 DOA: 04/11/2024  PCP: Tammy Tari DASEN, PA-C  Admit date: 04/11/2024 Discharge date: 04/15/2024  Admitted From: Home Disposition: Home  Recommendations for Outpatient Follow-up:  Follow up with PCP in 1-2 weeks Please obtain BMP/CBC in one week Surgery to follow-up on 12/8  Home Health: N/A Equipment/Devices: N/A  Discharge Condition: Stable CODE STATUS: Full code Diet recommendation: Regular diet  Discharge summary: 68 year old type 2 diabetes and hypertensive presented with 3 days of rectal pain, found to have nonthrombosed external hemorrhoids, came back to the ER next day and found to have perianal abscess.  Admitted with IV antibiotics.  Underwent endoscopy, drainage of posterior perineal abscess on 11/23.  Clinically improved.  Surgical cultures with multiple organisms.  Pain controlled with oral pain medications.  On IV Zosyn  in the hospital.  Plan: Status post I&D, dressing change instructions, sitz bath and wound care as per surgery. Ciprofloxacin  for additional 5 days. Surgery to schedule follow-up.  Chronic medical issues including Type 2 diabetes, resume metformin Hypertension, resume Cozaar   Stable to discharge home with outpatient follow-up.    Discharge Diagnoses:  Principal Problem:   Perianal abscess Active Problems:   Diabetes mellitus type II, controlled [facility])   Essential hypertension   External hemorrhoid    Discharge Instructions  Discharge Instructions     Diet - low sodium heart healthy   Complete by: As directed    Diet Carb Modified   Complete by: As directed    Discharge wound care:   Complete by: As directed    Sitz bath twice daily , clean the wound and keep covered with clean dressing as needed when soaked   Increase activity slowly   Complete by: As directed       Allergies as of 04/15/2024   No Known Allergies      Medication List      STOP taking these medications    amoxicillin -clavulanate 875-125 MG tablet Commonly known as: AUGMENTIN    benzonatate  100 MG capsule Commonly known as: TESSALON    hydrocortisone  2.5 % rectal cream Commonly known as: ANUSOL -HC   lidocaine  5 % Commonly known as: Lidoderm        TAKE these medications    ciprofloxacin  500 MG tablet Commonly known as: Cipro  Take 1 tablet (500 mg total) by mouth 2 (two) times daily for 5 days.   Fifty50 Glucose Meter 2.0 w/Device Kit Use as instructed   HYDROcodone -acetaminophen  5-325 MG tablet Commonly known as: NORCO/VICODIN Take 1 tablet by mouth every 6 (six) hours as needed (for pain).   losartan  50 MG tablet Commonly known as: COZAAR  Take 50 mg by mouth daily.   metFORMIN 500 MG tablet Commonly known as: GLUCOPHAGE Take 500 mg by mouth daily with breakfast.   naproxen  375 MG tablet Commonly known as: NAPROSYN  Take 1 tablet (375 mg total) by mouth 2 (two) times daily as needed for moderate pain.   Stool Softener/Laxative 50-8.6 MG tablet Generic drug: senna-docusate Take 1 tablet by mouth 2 (two) times daily.   tadalafil 5 MG tablet Commonly known as: CIALIS TAKE 1 TABLET BY MOUTH ONCE DAILY AS NEEDED FOR  ERECTILE DYSFUNCTION  AND AN EXTRA TABLET IF  NEEDED ON SOME DAYS               Discharge Care Instructions  (From admission, onward)           Start     Ordered   04/15/24 0000  Discharge  wound care:       Comments: Sitz bath twice daily , clean the wound and keep covered with clean dressing as needed when soaked   04/15/24 0913            Follow-up Information     Sheldon Standing, MD. Go on 04/27/2024.   Specialties: General Surgery, Colon and Rectal Surgery Why: At 11:30AM, Arrive 30 mins prior to scheduled appointment time, Please bring your insurance cards and ID. Contact information: [address] [place] [address] [phone]                No Known  Allergies  Consultations: General Surgery   Procedures/Studies: CT ABDOMEN PELVIS W CONTRAST Result Date: 04/11/2024 EXAM: CT ABDOMEN AND PELVIS WITH CONTRAST 04/11/2024 04:44:59 PM TECHNIQUE: CT of the abdomen and pelvis was performed with the administration of 100 mL of iohexol  (OMNIPAQUE ) 300 MG/ML solution. Multiplanar reformatted images are provided for review. Automated exposure control, iterative reconstruction, and/or weight-based adjustment of the mA/kV was utilized to reduce the radiation dose to as low as reasonably achievable. COMPARISON: None available. CLINICAL HISTORY: Abdominal pain, acute, nonlocalized; Rectal pain. FINDINGS: LOWER CHEST: Prominent atelectasis in the lung bases. Motion artifact limits examination. LIVER: The liver is unremarkable. GALLBLADDER AND BILE DUCTS: Gallbladder is unremarkable. No biliary ductal dilatation. SPLEEN: No acute abnormality. PANCREAS: No acute abnormality. ADRENAL GLANDS: No acute abnormality. KIDNEYS, URETERS AND BLADDER: Bilateral renal cysts, largest measuring up to 3.5 cm diameter. No imaging follow-up is indicated. No stones in the kidneys or ureters. No hydronephrosis. No perinephric or periureteral stranding. 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 GI AND BOWEL: The stomach, small bowel, and colon are not abnormally distended. No wall thickening or inflammatory infiltration is demonstrated. Scattered colonic diverticula without evidence of acute diverticulitis. The appendix is normal. There is a posterior perianal collection with an air-fluid level measuring 3.4 cm diameter. This appears to be posterior to the anorectal junction and likely represents a perianal abscess. Posterior rectocele would be less likely. PERITONEUM AND RETROPERITONEUM: No ascites. No free air. VASCULATURE: Aorta is normal in caliber. Calcification of the aorta. LYMPH NODES: No lymphadenopathy.  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 BONES AND SOFT TISSUES: Degenerative changes in the spine. 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 No acute osseous abnormality. IMPRESSION: 1. Posterior perianal collection with an air-fluid level measuring 3.4 cm, most consistent with a perianal abscess. Posterior rectocele is less likely. 2. Scattered colonic diverticula without evidence of acute diverticulitis. Electronically signed by: Elsie Gravely MD 04/11/2024 04:52 PM EST RP Workstation: [id number]   (Echo, Carotid, EGD, Colonoscopy, ERCP)    Subjective: Patient seen and examined.  Had mild discomfort at the buttock but denies any other complaints.  Mobilizing around.  Comfortable with plan to go home.   Discharge Exam: Vitals:   04/14/24 2001 04/15/24 0427  BP: (!) 154/81 (!) 164/82  Pulse: (!) 56 (!) 49  Resp: 15 15  Temp: 98.2 F (36.8 C) 97.7 F (36.5 C)  SpO2: 97% 98%   Vitals:   04/14/24 0928 04/14/24 1433 04/14/24 2001 04/15/24 0427  BP: (!) 168/83 (!) 156/85 (!) 154/81 (!) 164/82  Pulse: (!) 53 61 (!) 56 (!) 49  Resp: 15 16 15 15   Temp: (!) 97.5 F (36.4 C) 98.2 F (36.8 C)  98.2 F (36.8 C) 97.7 F (36.5 C)  TempSrc: Oral Oral Oral Oral  SpO2: 97% 97% 97% 98%  Weight:      Height:        General: Pt is alert, awake, not in acute distress Cardiovascular: RRR, S1/S2 +, no rubs, no gallops Respiratory: CTA bilaterally, no wheezing, no rhonchi Abdominal: Soft, NT, ND, bowel sounds + Extremities: no edema, no cyanosis Patient has surgical wound on the perineal area, drain intact.  Frank draining pus.    The results of significant diagnostics from this hospitalization (including imaging, microbiology, ancillary and laboratory) are listed below for reference.     Microbiology: Recent Results (from the past 240 hours)   Surgical pcr screen     Status: None   Collection Time: 04/12/24  9:18 AM   Specimen: Nasal Mucosa; Nasal Swab  Result Value Ref Range Status   MRSA, PCR NEGATIVE NEGATIVE Final   Staphylococcus aureus NEGATIVE NEGATIVE Final    Comment: (NOTE) The Xpert SA Assay (FDA approved for NASAL specimens in patients 91 years of age and older), is one component of a comprehensive surveillance program. It is not intended to diagnose infection nor to guide or monitor treatment. Performed at [facility], [address], [place], [place] [address]   Aerobic/Anaerobic Culture w Gram Stain (surgical/deep wound)     Status: None (Preliminary result)   Collection Time: 04/12/24 11:57 AM   Specimen: Path fluid; Body Fluid  Result Value Ref Range Status   Specimen Description   Final    FLUID Performed at [facility], [address] [place], [place] [address]    Special Requests   Final    NONE Performed at [facility], [address], [place], [place] [address]    Gram Stain   Final    RARE WBC SEEN MODERATE GRAM NEGATIVE RODS MODERATE GRAM POSITIVE RODS ABUNDANT GRAM POSITIVE COCCI Performed at [facility], [address] [place], [place] [address]    Culture   Final    MODERATE ESCHERICHIA COLI FEW PSEUDOMONAS AERUGINOSA SUSCEPTIBILITIES TO FOLLOW NO ANAEROBES ISOLATED; CULTURE IN PROGRESS FOR 5 DAYS    Report Status PENDING  Incomplete   Organism ID, Bacteria ESCHERICHIA COLI  Final      Susceptibility   Escherichia coli - MIC*    AMPICILLIN  <=2 SENSITIVE Sensitive     CEFAZOLIN  (NON-URINE) <=1 SENSITIVE Sensitive     CEFEPIME <=0.12 SENSITIVE Sensitive     ERTAPENEM <=0.12 SENSITIVE Sensitive     CEFTRIAXONE <=0.25 SENSITIVE Sensitive     CIPROFLOXACIN  0.25 SENSITIVE Sensitive     GENTAMICIN <=1 SENSITIVE Sensitive     MEROPENEM <=0.25 SENSITIVE Sensitive     TRIMETH /SULFA  <=20 SENSITIVE Sensitive      AMPICILLIN /SULBACTAM <=2 SENSITIVE Sensitive     PIP/TAZO Value in next row Sensitive      <=4 SENSITIVEThis is a modified FDA-approved test that has been validated and its performance characteristics determined by the reporting laboratory.  This laboratory is certified under the Clinical Laboratory Improvement Amendments CLIA as qualified to perform high complexity clinical laboratory testing.    * MODERATE ESCHERICHIA COLI     Labs: BNP (last 3 results) No results for input(s): BNP in the last 8760 hours. Basic Metabolic Panel: Recent Labs  Lab 04/11/24 0302 04/12/24 0529 04/13/24 0432 04/14/24 0445 04/15/24 0512  NA 136 137 136 138 138  K 3.7 4.1 4.4 4.1 4.2  CL 100  101 101 102 101  CO2 25 27 27 28 28   GLUCOSE 166* 152* 155* 132* 146*  BUN 9 8 14 12 11   CREATININE 0.99 0.92 0.95 0.89 0.93  CALCIUM 9.7 9.3 9.0 8.9 9.2  MG  --  2.2 2.5*  --   --   PHOS  --  2.8  --   --   --    Liver Function Tests: Recent Labs  Lab 04/11/24 0302 04/12/24 0529  AST 21 14*  ALT 24 18  ALKPHOS 66 71  BILITOT 0.5 0.4  PROT 8.0 7.3  ALBUMIN 4.3 3.6   No results for input(s): LIPASE, AMYLASE in the last 168 hours. No results for input(s): AMMONIA in the last 168 hours. CBC: Recent Labs  Lab 04/11/24 0302 04/12/24 0529 04/13/24 0432 04/14/24 0445 04/15/24 0512  WBC 10.1 11.2* 10.7* 7.1 5.9  NEUTROABS  --   --  8.3* 3.9 2.9  HGB 13.4 12.5* 12.5* 12.4* 12.7*  HCT 40.9 37.3* 38.7* 37.7* 38.9*  MCV 92.7 93.0 95.1 94.3 94.2  PLT 351 318 345 373 405*   Cardiac Enzymes: No results for input(s): CKTOTAL, CKMB, CKMBINDEX, TROPONINI in the last 168 hours. BNP: Invalid input(s): POCBNP CBG: Recent Labs  Lab 04/14/24 1634 04/14/24 2000 04/15/24 0020 04/15/24 0423 04/15/24 0735  GLUCAP 215* 207* 178* 148* 135*   D-Dimer No results for input(s): DDIMER in the last 72 hours. Hgb A1c No results for input(s): HGBA1C in the last 72 hours. Lipid Profile No  results for input(s): CHOL, HDL, LDLCALC, TRIG, CHOLHDL, LDLDIRECT in the last 72 hours. Thyroid function studies No results for input(s): TSH, T4TOTAL, T3FREE, THYROIDAB in the last 72 hours.  Invalid input(s): FREET3 Anemia work up No results for input(s): VITAMINB12, FOLATE, FERRITIN, TIBC, IRON, RETICCTPCT in the last 72 hours. Urinalysis    Component Value Date/Time   COLORURINE YELLOW 04/12/2024 0911   APPEARANCEUR CLEAR 04/12/2024 0911   LABSPEC 1.038 (H) 04/12/2024 0911   PHURINE 5.0 04/12/2024 0911   GLUCOSEU 150 (A) 04/12/2024 0911   HGBUR SMALL (A) 04/12/2024 0911   BILIRUBINUR NEGATIVE 04/12/2024 0911   KETONESUR 20 (A) 04/12/2024 0911   PROTEINUR 30 (A) 04/12/2024 0911   NITRITE NEGATIVE 04/12/2024 0911   LEUKOCYTESUR NEGATIVE 04/12/2024 0911   Sepsis Labs Recent Labs  Lab 04/12/24 0529 04/13/24 0432 04/14/24 0445 04/15/24 0512  WBC 11.2* 10.7* 7.1 5.9   Microbiology Recent Results (from the past 240 hours)  Surgical pcr screen     Status: None   Collection Time: 04/12/24  9:18 AM   Specimen: Nasal Mucosa; Nasal Swab  Result Value Ref Range Status   MRSA, PCR NEGATIVE NEGATIVE Final   Staphylococcus aureus NEGATIVE NEGATIVE Final    Comment: (NOTE) The Xpert SA Assay (FDA approved for NASAL specimens in patients 19 years of age and older), is one component of a comprehensive surveillance program. It is not intended to diagnose infection nor to guide or monitor treatment. Performed at [facility], [address], [place], [place] [address]   Aerobic/Anaerobic Culture w Gram Stain (surgical/deep wound)     Status: None (Preliminary result)   Collection Time: 04/12/24 11:57 AM   Specimen: Path fluid; Body Fluid  Result Value Ref Range Status   Specimen Description   Final    FLUID Performed at [facility], [address] [place], [place] [address]    Special Requests   Final     NONE Performed at [facility], [address] [place], [place] [address]    Gram Stain   Final    RARE WBC SEEN MODERATE GRAM NEGATIVE RODS MODERATE GRAM POSITIVE RODS ABUNDANT GRAM POSITIVE COCCI Performed at [facility], [address], [place], [place] [address]    Culture   Final    MODERATE ESCHERICHIA COLI FEW PSEUDOMONAS AERUGINOSA SUSCEPTIBILITIES TO FOLLOW NO ANAEROBES ISOLATED; CULTURE IN PROGRESS FOR 5 DAYS    Report Status PENDING  Incomplete   Organism ID, Bacteria ESCHERICHIA COLI  Final      Susceptibility   Escherichia coli - MIC*    AMPICILLIN  <=2 SENSITIVE Sensitive     CEFAZOLIN  (NON-URINE) <=1 SENSITIVE Sensitive     CEFEPIME <=0.12 SENSITIVE Sensitive     ERTAPENEM <=0.12 SENSITIVE Sensitive     CEFTRIAXONE <=0.25 SENSITIVE Sensitive     CIPROFLOXACIN  0.25 SENSITIVE Sensitive     GENTAMICIN <=1 SENSITIVE Sensitive     MEROPENEM <=0.25 SENSITIVE Sensitive     TRIMETH /SULFA  <=20 SENSITIVE Sensitive     AMPICILLIN /SULBACTAM <=2 SENSITIVE Sensitive     PIP/TAZO Value in next row Sensitive      <=4 SENSITIVEThis is a modified FDA-approved test that has been validated and its performance characteristics determined by the reporting laboratory.  This laboratory is certified under the Clinical Laboratory Improvement Amendments CLIA as qualified to perform high complexity clinical laboratory testing.    * MODERATE ESCHERICHIA COLI     Time coordinating discharge: 32 minutes  SIGNED:   Renato Applebaum, MD  [facility] 04/15/2024, 1:13 PM

## 2024-04-16 LAB — AEROBIC/ANAEROBIC CULTURE W GRAM STAIN (SURGICAL/DEEP WOUND)

## 2024-06-23 ENCOUNTER — Encounter: Payer: Self-pay | Admitting: Gastroenterology

## 2024-06-23 ENCOUNTER — Ambulatory Visit: Admitting: Gastroenterology

## 2024-06-23 VITALS — BP 136/70 | HR 80 | Ht 69.0 in | Wt 206.0 lb

## 2024-06-23 DIAGNOSIS — K603 Anal fistula, unspecified: Secondary | ICD-10-CM

## 2024-06-23 DIAGNOSIS — K610 Anal abscess: Secondary | ICD-10-CM

## 2024-06-23 DIAGNOSIS — K605 Anorectal fistula, unspecified: Secondary | ICD-10-CM | POA: Diagnosis not present

## 2024-06-23 MED ORDER — NA SULFATE-K SULFATE-MG SULF 17.5-3.13-1.6 GM/177ML PO SOLN: 1.0000 | Freq: Once | ORAL | 0 refills | Status: AC

## 2024-06-23 NOTE — Patient Instructions (Signed)
 You have been scheduled for a colonoscopy. Please follow written instructions given to you at your visit today.   If you use inhalers (even only as needed), please bring them with you on the day of your procedure.  DO NOT TAKE 7 DAYS PRIOR TO TEST- Trulicity (dulaglutide) Ozempic, Wegovy (semaglutide) Mounjaro, Zepbound (tirzepatide) Bydureon Bcise (exanatide extended release)  DO NOT TAKE 1 DAY PRIOR TO YOUR TEST Rybelsus (semaglutide) Adlyxin (lixisenatide) Victoza (liraglutide) Byetta (exanatide) ___________________________________________________________________________   We have sent the following medications to your pharmacy for you to pick up at your convenience: Suprep  _______________________________________________________  If your blood pressure at your visit was 140/90 or greater, please contact your primary care physician to follow up on this.  _______________________________________________________  If you are age 70 or older, your body mass index should be between 23-30. Your Body mass index is 30.42 kg/m. If this is out of the aforementioned range listed, please consider follow up with your Primary Care Provider.  If you are age 82 or younger, your body mass index should be between 19-25. Your Body mass index is 30.42 kg/m. If this is out of the aformentioned range listed, please consider follow up with your Primary Care Provider.   ________________________________________________________  The [facility] providers would like to encourage you to use MYCHART to communicate with providers for non-urgent requests or questions.  Due to long hold times on the telephone, sending your provider a message by [facility] may be a faster and more efficient way to get a response.  Please allow 48 business hours for a response.  Please remember that this is for non-urgent requests.  _______________________________________________________  [facility] is using a  team-based approach to care.  Your team is made up of your doctor and two to three APPS. Our APPS (Nurse Practitioners and Physician Assistants) work with your physician to ensure care continuity for you. They are fully qualified to address your health concerns and develop a treatment plan. They communicate directly with your gastroenterologist to care for you. Seeing the Advanced Practice Practitioners on your physician's team can help you by facilitating care more promptly, often allowing for earlier appointments, access to diagnostic testing, procedures, and other specialty referrals.   Due to recent changes in healthcare laws, you may see the results of your imaging and laboratory studies on MyChart before your provider has had a chance to review them.  We understand that in some cases there may be results that are confusing or concerning to you. Not all laboratory results come back in the same time frame and the provider may be waiting for multiple results in order to interpret others.  Please give us  48 hours in order for your provider to thoroughly review all the results before contacting the office for clarification of your results.

## 2024-07-24 ENCOUNTER — Encounter: Admitting: Gastroenterology
